# Patient Record
Sex: Female | Born: 1940 | Race: White | Hispanic: No | Smoking: Former smoker
Health system: Southern US, Community
[De-identification: ages and names within clinical notes are randomized; demographics above are authoritative.]

## PROBLEM LIST (undated history)

## (undated) DIAGNOSIS — N189 Chronic kidney disease, unspecified: Secondary | ICD-10-CM

## (undated) DIAGNOSIS — M719 Bursopathy, unspecified: Secondary | ICD-10-CM

## (undated) DIAGNOSIS — I10 Essential (primary) hypertension: Secondary | ICD-10-CM

## (undated) DIAGNOSIS — R251 Tremor, unspecified: Secondary | ICD-10-CM

## (undated) DIAGNOSIS — M8580 Other specified disorders of bone density and structure, unspecified site: Secondary | ICD-10-CM

## (undated) DIAGNOSIS — L900 Lichen sclerosus et atrophicus: Secondary | ICD-10-CM

## (undated) DIAGNOSIS — G809 Cerebral palsy, unspecified: Secondary | ICD-10-CM

## (undated) HISTORY — PX: CATARACT EXTRACTION: SUR2

## (undated) HISTORY — DX: Essential (primary) hypertension: I10

## (undated) HISTORY — DX: Lichen sclerosus et atrophicus: L90.0

## (undated) HISTORY — DX: Chronic kidney disease, unspecified: N18.9

## (undated) HISTORY — PX: TONSILLECTOMY: SUR1361

## (undated) HISTORY — DX: Other specified disorders of bone density and structure, unspecified site: M85.80

## (undated) HISTORY — DX: Cerebral palsy, unspecified: G80.9

## (undated) HISTORY — DX: Bursopathy, unspecified: M71.9

## (undated) HISTORY — DX: Tremor, unspecified: R25.1

---

## 1973-05-17 HISTORY — PX: ABDOMINAL HYSTERECTOMY: SHX81

## 1999-09-15 ENCOUNTER — Encounter: Payer: Self-pay | Admitting: Obstetrics and Gynecology

## 1999-09-15 ENCOUNTER — Encounter: Admission: RE | Admit: 1999-09-15 | Discharge: 1999-09-15 | Payer: Self-pay | Admitting: Obstetrics and Gynecology

## 2000-09-15 ENCOUNTER — Encounter: Admission: RE | Admit: 2000-09-15 | Discharge: 2000-09-15 | Payer: Self-pay | Admitting: Obstetrics and Gynecology

## 2000-09-15 ENCOUNTER — Encounter: Payer: Self-pay | Admitting: Obstetrics and Gynecology

## 2001-09-20 ENCOUNTER — Encounter: Admission: RE | Admit: 2001-09-20 | Discharge: 2001-09-20 | Payer: Self-pay | Admitting: Obstetrics and Gynecology

## 2001-09-20 ENCOUNTER — Encounter: Payer: Self-pay | Admitting: Obstetrics and Gynecology

## 2002-09-24 ENCOUNTER — Encounter: Admission: RE | Admit: 2002-09-24 | Discharge: 2002-09-24 | Payer: Self-pay | Admitting: Obstetrics and Gynecology

## 2002-09-24 ENCOUNTER — Encounter: Payer: Self-pay | Admitting: Obstetrics and Gynecology

## 2008-02-07 ENCOUNTER — Other Ambulatory Visit: Admission: RE | Admit: 2008-02-07 | Discharge: 2008-02-07 | Payer: Self-pay | Admitting: Obstetrics and Gynecology

## 2008-02-07 ENCOUNTER — Encounter: Payer: Self-pay | Admitting: Obstetrics and Gynecology

## 2008-02-07 ENCOUNTER — Ambulatory Visit: Payer: Self-pay | Admitting: Obstetrics and Gynecology

## 2008-03-25 ENCOUNTER — Ambulatory Visit: Payer: Self-pay | Admitting: Obstetrics and Gynecology

## 2009-02-25 ENCOUNTER — Other Ambulatory Visit: Admission: RE | Admit: 2009-02-25 | Discharge: 2009-02-25 | Payer: Self-pay | Admitting: Obstetrics and Gynecology

## 2009-02-25 ENCOUNTER — Ambulatory Visit: Payer: Self-pay | Admitting: Obstetrics and Gynecology

## 2010-03-03 ENCOUNTER — Ambulatory Visit: Payer: Self-pay | Admitting: Obstetrics and Gynecology

## 2010-03-03 ENCOUNTER — Other Ambulatory Visit: Admission: RE | Admit: 2010-03-03 | Discharge: 2010-03-03 | Payer: Self-pay | Admitting: Obstetrics and Gynecology

## 2010-05-14 ENCOUNTER — Ambulatory Visit: Payer: Self-pay | Admitting: Obstetrics and Gynecology

## 2011-02-24 DIAGNOSIS — I10 Essential (primary) hypertension: Secondary | ICD-10-CM | POA: Insufficient documentation

## 2011-02-24 DIAGNOSIS — G809 Cerebral palsy, unspecified: Secondary | ICD-10-CM | POA: Insufficient documentation

## 2011-02-24 DIAGNOSIS — N952 Postmenopausal atrophic vaginitis: Secondary | ICD-10-CM | POA: Insufficient documentation

## 2011-03-09 ENCOUNTER — Ambulatory Visit (INDEPENDENT_AMBULATORY_CARE_PROVIDER_SITE_OTHER): Payer: Medicare Other | Admitting: Obstetrics and Gynecology

## 2011-03-09 ENCOUNTER — Encounter: Payer: Self-pay | Admitting: Obstetrics and Gynecology

## 2011-03-09 VITALS — BP 124/76 | Ht 59.0 in | Wt 101.0 lb

## 2011-03-09 DIAGNOSIS — Z23 Encounter for immunization: Secondary | ICD-10-CM

## 2011-03-09 DIAGNOSIS — D069 Carcinoma in situ of cervix, unspecified: Secondary | ICD-10-CM

## 2011-03-09 DIAGNOSIS — Z780 Asymptomatic menopausal state: Secondary | ICD-10-CM

## 2011-03-09 DIAGNOSIS — N762 Acute vulvitis: Secondary | ICD-10-CM

## 2011-03-09 DIAGNOSIS — N951 Menopausal and female climacteric states: Secondary | ICD-10-CM

## 2011-03-09 DIAGNOSIS — N760 Acute vaginitis: Secondary | ICD-10-CM

## 2011-03-09 DIAGNOSIS — N952 Postmenopausal atrophic vaginitis: Secondary | ICD-10-CM

## 2011-03-09 MED ORDER — CLOBETASOL PROPIONATE 0.05 % EX CREA: TOPICAL_CREAM | Freq: Two times a day (BID) | CUTANEOUS | Status: DC

## 2011-03-09 NOTE — Progress Notes (Signed)
Subjective:     Patient ID: Wendy Lewis, female   DOB: Oct 22, 1940, 69 y.o.   MRN: [id number]  HPIpatient came back to see me today for further followup. Her menopausal symptoms have resolved to a point that they are not bothering her any more. She is having no vaginal bleeding. She is having no pelvic pain. She is a normal mammogram last month. She does her lab through her PCP. She's had several normal bone densities. Her biggest issue now is her vulvitis. It seems to come and go. He responds well to the Temovate we give her. She is having no dysuria frequency or urgency. She also has vaginal dryness but does not require intervention. She has a history of CIN but has had normal Pap smears recently.   Review of Systems  Constitutional: Negative.   HENT: Negative.   Eyes: Negative.   Respiratory: Negative.   Cardiovascular:       Hypertension well-controlled on lisinopril and Microzide.  Gastrointestinal: Negative.   Genitourinary: Negative.   Musculoskeletal: Negative.   Skin: Negative.   Neurological:       Cerebral palsy  Hematological: Negative.   Psychiatric/Behavioral: Negative.        Objective:   Physical ExamHEENT: Within normal limits. Kennon Portela present. Neck: No masses. Supraclavicular lymph nodes: Not enlarged. Breasts: Examined in both sitting and lying position. Symmetrical without skin changes or masses. Abdomen: Soft no masses guarding or rebound. No hernias. Pelvic: External within normal limits. BUS within normal limits. Vaginal examination shows good estrogen effect, no cystocele enterocele or rectocele. Cervix and uterus absent. Adnexa within normal limits. Rectovaginal confirmatory. Extremities within normal limits.      Assessment:     #1. Vulvitis #2. Menopausal symptoms #3. Atrophic vaginitis #4. CIN    Plan:    continue yearly mammograms. Continue Temovate ointment. New prescription written.

## 2011-10-14 ENCOUNTER — Other Ambulatory Visit: Payer: Self-pay | Admitting: Family Medicine

## 2011-10-14 DIAGNOSIS — N183 Chronic kidney disease, stage 3 unspecified: Secondary | ICD-10-CM

## 2011-10-19 ENCOUNTER — Ambulatory Visit
Admission: RE | Admit: 2011-10-19 | Discharge: 2011-10-19 | Disposition: A | Discharge status: Home / Self Care | Payer: Medicare Other | Source: Ambulatory Visit | Attending: Family Medicine | Admitting: Family Medicine

## 2012-03-09 ENCOUNTER — Encounter: Payer: Medicare Other | Admitting: Obstetrics and Gynecology

## 2012-04-18 ENCOUNTER — Ambulatory Visit (INDEPENDENT_AMBULATORY_CARE_PROVIDER_SITE_OTHER): Payer: Medicare Other | Admitting: Obstetrics and Gynecology

## 2012-04-18 ENCOUNTER — Encounter: Payer: Self-pay | Admitting: Obstetrics and Gynecology

## 2012-04-18 VITALS — BP 120/76 | Ht 60.0 in | Wt 108.0 lb

## 2012-04-18 DIAGNOSIS — N951 Menopausal and female climacteric states: Secondary | ICD-10-CM

## 2012-04-18 DIAGNOSIS — N952 Postmenopausal atrophic vaginitis: Secondary | ICD-10-CM

## 2012-04-18 DIAGNOSIS — N871 Moderate cervical dysplasia: Secondary | ICD-10-CM

## 2012-04-18 DIAGNOSIS — N904 Leukoplakia of vulva: Secondary | ICD-10-CM

## 2012-04-18 DIAGNOSIS — R232 Flushing: Secondary | ICD-10-CM

## 2012-04-18 MED ORDER — CLOBETASOL PROPIONATE 0.05 % EX CREA: TOPICAL_CREAM | Freq: Two times a day (BID) | CUTANEOUS | Status: DC

## 2012-04-18 NOTE — Progress Notes (Signed)
Patient came to see me today for further followup. 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 She has had normal Pap smears since her surgery. Her last Pap was 2011. She was bothered for years with hot flashes and took HRT. She has been off HRT since 2009 and is just minimally symptomatic. She is having no vaginal bleeding. She is having no pelvic pain. Her last bone density was December, 2011 and was normal. We are treating her with Temovate for recurrent vulvitis with excellent results. She does have significant atrophic vaginitis but is not sexually active. She sees both Dr. Cliffton Asters and the nephrologist and does her lab work through their offices. She has a kidney disease That does not require treatment and she was told by the nephrologist that she will not need dialysis ever.She had a mammogram this year and got a letter that It was normal. We did not get a copy but she will call and get [place] one.   ROS: 12 system review done. Pertinent positives above. Other positives are  hypertension and cerebral palsy.  HEENT: Within normal limits.Kennon Portela present. Neck: No masses. Supraclavicular lymph nodes: Not enlarged. Breasts: Examined in both sitting and lying position. Symmetrical without skin changes or masses. Abdomen: Soft no masses guarding or rebound. No hernias. Pelvic: External within normal limits. BUS within normal limits. Vaginal examination shows poor  estrogen effect, no cystocele enterocele or rectocele. Cervix and uterus absent. Adnexa within normal limits. Rectovaginal confirmatory. Extremities within normal limits.  Assessment: #1. Cervical dysplasia #2. Atrophic vaginitis #3.  Recurrent vulvitis #4. Menopausal symptoms-resolving  Plan: Continue Temovate. Continue yearly mammograms. Patient to get [place]  the mammogram report. Pap not done.The new Pap smear guidelines were discussed with the patient.

## 2012-04-18 NOTE — Patient Instructions (Signed)
Please get me a copy of your mammogram report from 2013.

## 2012-04-19 ENCOUNTER — Encounter: Payer: Self-pay | Admitting: Obstetrics and Gynecology

## 2013-05-01 ENCOUNTER — Encounter: Payer: Self-pay | Admitting: Gynecology

## 2013-05-01 ENCOUNTER — Other Ambulatory Visit (HOSPITAL_COMMUNITY)
Admission: RE | Admit: 2013-05-01 | Discharge: 2013-05-01 | Disposition: A | Discharge status: Home / Self Care | Payer: Medicare Other | Source: Ambulatory Visit | Attending: Gynecology | Admitting: Gynecology

## 2013-05-01 ENCOUNTER — Ambulatory Visit (INDEPENDENT_AMBULATORY_CARE_PROVIDER_SITE_OTHER): Payer: Medicare Other | Admitting: Gynecology

## 2013-05-01 VITALS — BP 122/74 | Ht 59.0 in | Wt 107.0 lb

## 2013-05-01 DIAGNOSIS — Z124 Encounter for screening for malignant neoplasm of cervix: Secondary | ICD-10-CM | POA: Insufficient documentation

## 2013-05-01 DIAGNOSIS — N763 Subacute and chronic vulvitis: Secondary | ICD-10-CM

## 2013-05-01 DIAGNOSIS — N760 Acute vaginitis: Secondary | ICD-10-CM

## 2013-05-01 DIAGNOSIS — N952 Postmenopausal atrophic vaginitis: Secondary | ICD-10-CM

## 2013-05-01 DIAGNOSIS — Z1272 Encounter for screening for malignant neoplasm of vagina: Secondary | ICD-10-CM

## 2013-05-01 NOTE — Progress Notes (Signed)
Wendy Lewis February 10, 1941 [id number]        72 y.o.  G2P2002 for followup exam.  Former patient Wendy Lewis. Several issues noted below.  Past medical history,surgical history, problem list, medications, allergies, family history and social history were all reviewed and documented in the EPIC chart.  ROS:  Performed and pertinent positives and negatives are included in the history, assessment and plan .  Exam: [name] Filed Vitals:   05/01/13 1359  BP: 122/74  Height: 4\' 11"  (1.499 m)  Weight: 107 lb (48.535 kg)   General appearance  Normal Skin grossly normal Head/Neck normal with no cervical or supraclavicular adenopathy thyroid normal Lungs  clear Cardiac RR, without RMG Abdominal  soft, nontender, without masses, organomegaly or hernia Breasts  examined lying and sitting without masses, retractions, discharge or axillary adenopathy. Pelvic  Ext/BUS/vagina  generalized atrophic changes with irritative vulvitis posterior fourchette/perineal body  Adnexa  Without masses or tenderness    Anus and perineum  Normal   Rectovaginal  Normal sphincter tone without palpated masses or tenderness.    Assessment/Plan:  72 y.o. [id number] female for followup exam.   1. Postmenopausal/atrophic genital changes/irritative vulvitis. Patient had been on ERT in the past but has discontinued doing well without significant hot flashes or night sweats. Is not sexually active. Status post TAH in 1975 for cervical dysplasia. Does have irritative vulvitis that she uses Temovate cream intermittently. This seems to control her symptoms and has been doing this for years. I reviewed that I do not want her to use it every day as it can thin the skin and she understands and uses the intermittently. She has a prescription at home but will call when she needs more. 2. Pap smear 2011. Patient has noted under history CIN-1 but under prior note by Dr. Eda Paschal said severe dysplasia for indication for  hysterectomy. Given lack of clarity as to the significance of her dysplasia I will continue to do Pap smears and less frequent interval and a Pap smear was done today. We'll plan every 3 year Pap smears. 3. Mammography reported this September. Last mammogram in chart 2013 the patient is sure that she had it done now. We'll continue with annual mammography. SBE monthly reviewed. 4. DEXA 2011 normal. Recommend repeat at 5 year interval. Increase calcium vitamin D reviewed. 5. Colonoscopy 6 years ago with planned repeat interval at 5 years. 6. Health maintenance. No blood work done as this is all done through her primary physician's office. Followup one year, sooner as needed.   Note: This document was prepared with digital dictation and possible smart phrase technology. Any transcriptional errors that result from this process are unintentional.   [name] MD, 2:20 PM 05/01/2013

## 2013-05-01 NOTE — Patient Instructions (Signed)
Follow up in one year for annual exam 

## 2013-05-01 NOTE — Addendum Note (Signed)
Addended by: Dayna Barker on: 05/01/2013 02:30 PM   Modules accepted: Orders

## 2013-05-09 ENCOUNTER — Encounter: Payer: Self-pay | Admitting: Obstetrics and Gynecology

## 2014-03-18 ENCOUNTER — Encounter: Payer: Self-pay | Admitting: Gynecology

## 2014-05-03 ENCOUNTER — Encounter: Payer: Self-pay | Admitting: Gynecology

## 2014-05-03 ENCOUNTER — Ambulatory Visit (INDEPENDENT_AMBULATORY_CARE_PROVIDER_SITE_OTHER): Payer: Medicare Other | Admitting: Gynecology

## 2014-05-03 VITALS — BP 118/74 | Ht 60.0 in | Wt 107.0 lb

## 2014-05-03 DIAGNOSIS — N762 Acute vulvitis: Secondary | ICD-10-CM

## 2014-05-03 DIAGNOSIS — N952 Postmenopausal atrophic vaginitis: Secondary | ICD-10-CM

## 2014-05-03 MED ORDER — CLOBETASOL PROPIONATE 0.05 % EX CREA: TOPICAL_CREAM | CUTANEOUS | Status: DC

## 2014-05-03 NOTE — Progress Notes (Signed)
Wendy Lewis December 21, 1940 [id number]        73 y.o.  G2P2002 for follow up exam. Several issues noted below.  Past medical history,surgical history, problem list, medications, allergies, family history and social history were all reviewed and documented as reviewed in the EPIC chart.  ROS:  Performed with pertinent positives and negatives included in the history, assessment and plan.   Additional significant findings :  none   Exam: Wendy Lewis Filed Vitals:   05/03/14 1527  BP: 118/74  Height: 5' (1.524 m)  Weight: 107 lb (48.535 kg)   General appearance:  Normal affect, orientation and appearance. Skin: Grossly normal HEENT: Without gross lesions.  No cervical or supraclavicular adenopathy. Thyroid normal.  Lungs:  Clear without wheezing, rales or rhonchi Cardiac: RR, without RMG Abdominal:  Soft, nontender, without masses, guarding, rebound, organomegaly or hernia Breasts:  Examined lying and sitting without masses, retractions, discharge or axillary adenopathy. Pelvic:  Ext/BUS/vagina with significant atrophic changes.  Adnexa  Without masses or tenderness    Anus and perineum  Normal   Rectovaginal  Normal sphincter tone without palpated masses or tenderness.    Assessment/Plan:  73 y.o. [id number] female for follow up exam.   1. Postmenopausal/atrophic genital changes/tonic vulvitis.  No significant hot flushes, night sweats. Patient using clobetasol 0.05% cream as needed for irritation and seems to be doing well with this. Uses one tube over a years time. I reviewed options to consider vaginal estrogen see if we cannot strengthen the tissues but she is not interested in this. She had taken HRT years ago but does not want to consider estrogen at this time. Patient will follow up if she has any issues with her vulvitis. I refilled her clobetasol 1 year.  2. Pap smear 2014. History of high-grade dysplasia per Dr. Verl DickerGottsegen's note as a reason for her hysterectomy. No Pap smear  done today. We'll continue with screening at 3 year intervals. 3. Mammography 01/2014. Continue with annual mammography. SBE monthly reviewed. 4. Colonoscopy 7 years ago with reported repeat interval 10 years. 5. DEXA 2011 normal. Plan repeat next year at five-year interval. Increased calcium vitamin D reviewed. 6. Health maintenance. No routine lab work done as patient reports this done at her primary physician's office. Patient also reports having received the Pneumovax and Zostavax vaccines.  Follow up in one year, sooner as needed.     Wendy Lewis,Wendy Graziani P MD, 3:46 PM 05/03/2014

## 2014-05-03 NOTE — Patient Instructions (Signed)
Use the clobetasol cream as needed for irritation. Call me if you have any problems. Call me if you do any vaginal bleeding.

## 2015-05-15 ENCOUNTER — Encounter: Payer: Self-pay | Admitting: Gynecology

## 2015-05-15 ENCOUNTER — Ambulatory Visit (INDEPENDENT_AMBULATORY_CARE_PROVIDER_SITE_OTHER): Payer: Medicare Other | Admitting: Gynecology

## 2015-05-15 VITALS — BP 116/74 | Ht 59.0 in | Wt 105.0 lb

## 2015-05-15 DIAGNOSIS — Z01419 Encounter for gynecological examination (general) (routine) without abnormal findings: Secondary | ICD-10-CM

## 2015-05-15 DIAGNOSIS — N760 Acute vaginitis: Secondary | ICD-10-CM

## 2015-05-15 DIAGNOSIS — N952 Postmenopausal atrophic vaginitis: Secondary | ICD-10-CM | POA: Diagnosis not present

## 2015-05-15 LAB — WET PREP FOR TRICH, YEAST, CLUE
Clue Cells Wet Prep HPF POC: NONE SEEN
Trich, Wet Prep: NONE SEEN
Yeast Wet Prep HPF POC: NONE SEEN

## 2015-05-15 MED ORDER — FLUCONAZOLE 150 MG PO TABS: 150.0000 mg | ORAL_TABLET | Freq: Every day | ORAL | Status: DC

## 2015-05-15 MED ORDER — NYSTATIN 100000 UNIT/GM EX CREA: 1.0000 "application " | TOPICAL_CREAM | Freq: Two times a day (BID) | CUTANEOUS | Status: DC

## 2015-05-15 NOTE — Progress Notes (Signed)
Wendy Lewis March 31, 1941 [id number]        74 y.o.  G2P2002  for breast and pelvic exam. Several issues noted below  Past medical history,surgical history, problem list, medications, allergies, family history and social history were all reviewed and documented as reviewed in the EPIC chart.  ROS:  Performed with pertinent positives and negatives included in the history, assessment and plan.   Additional significant findings :  none   Exam: Charity fundraiserBlanca assistant Filed Vitals:   05/15/15 1519  BP: 116/74  Height: 4\' 11"  (1.499 m)  Weight: 105 lb (47.628 kg)   General appearance:  Normal affect, orientation and appearance. Skin: Grossly normal HEENT: Without gross lesions.  No cervical or supraclavicular adenopathy. Thyroid normal.  Lungs:  Clear without wheezing, rales or rhonchi Cardiac: RR, without RMG Abdominal:  Soft, nontender, without masses, guarding, rebound, organomegaly or hernia Breasts:  Examined lying and sitting without masses, retractions, discharge or axillary adenopathy. Pelvic:  Ext/BUS/vagina with intense symmetrical vulvitis from periclitoral region to perianal region. No specific lesions. Overall atrophic changes. No significant discharge  Adnexa  Without masses or tenderness    Anus and perineum  Normal   Rectovaginal  Normal sphincter tone without palpated masses or tenderness.    Assessment/Plan:  74 y.o. [id number] female for breast and pelvic exam.  1. Vulvitis. Patient has been treated intermittently with clobetasol over the past several years by Dr. Eda PaschalGottsegen and subsequent a by myself with good relief. Now notes that the itching and irritation has persisted. Does have a symmetrical erythematous vulvitis with no specific lesions. No significant discharge. Wet prep negative. I'm going to cover her for yeast with Diflucan 150 mg daily 7 days and nystatin cream twice daily. I have asked her to make appointment to see me in one month in follow up. 2. Pap smear  2014.  No Pap smear done today.  History of dysplasia for reason for hysterectomy in 1975 with normal Pap smears afterwards. 3. Mammography 2016. Continue with annual mammography when due. SBE monthly reviewed. 4. Colonoscopy 8 years ago. Reported repeat interval 10 years. 5. DEXA 2011 normal. Recommend repeat DEXA now five-year interval. Patient agrees to schedule. 6. Health maintenance. No routine lab work done today as this is done at her primary physician's office. Follow up 1 year, sooner as needed.   Wendy Lewis,Wendy Lewis, 3:54 PM 05/15/2015

## 2015-05-15 NOTE — Patient Instructions (Signed)
Take the Diflucan pill daily for 7 days  Apply the nystatin cream externally around the vagina twice daily  Follow up in one month for reexamination  Schedule your bone density sometime over the next several months.

## 2015-05-15 NOTE — Addendum Note (Signed)
Addended by: Berna SpareASTILLO, [name] A on: 05/15/2015 04:32 PM   Modules accepted: Orders

## 2015-05-18 DIAGNOSIS — L900 Lichen sclerosus et atrophicus: Secondary | ICD-10-CM

## 2015-05-18 HISTORY — DX: Lichen sclerosus et atrophicus: L90.0

## 2015-06-11 ENCOUNTER — Ambulatory Visit (INDEPENDENT_AMBULATORY_CARE_PROVIDER_SITE_OTHER): Payer: Medicare Other | Admitting: Gynecology

## 2015-06-11 ENCOUNTER — Encounter: Payer: Self-pay | Admitting: Gynecology

## 2015-06-11 VITALS — BP 118/76

## 2015-06-11 DIAGNOSIS — N9089 Other specified noninflammatory disorders of vulva and perineum: Secondary | ICD-10-CM | POA: Diagnosis not present

## 2015-06-11 NOTE — Progress Notes (Signed)
KEDRA MCGLADE 1940/12/05 [id number]        75 y.o.  G2P2002 presents in follow up having recently been seen for intense vulvitis thought to be related to yeast and treated with nystatin cream and Diflucan orally 1 week.  Her symptoms seem a little better but not substantially.  Past medical history,surgical history, problem list, medications, allergies, family history and social history were all reviewed and documented in the EPIC chart.  Directed ROS with pertinent positives and negatives documented in the history of present illness/assessment and plan.  Exam: Kennon Portela assistant Filed Vitals:   06/11/15 1516  BP: 118/76   General appearance:  Normal With symmetrical generalized intense vulvitis from the periclitoral region to the perianal region. Intermittent blanching suggestive of possible lichen sclerosis. Vaginal exam with significant atrophy and no lesions.  Procedure: Representative area of blanching right peroneal body cleansed with Betadine, infiltrated with 1% Xylocaine and a skin biopsy was taken. Silver nitrate hemostasis applied. Patient tolerated well.  Assessment/Plan:  75 y.o. [id number] with intense persistent vulvitis. Suspect possible lichen sclerosis. Had been treated in the past with clobetasol intermittently for similar symptoms. Will follow up for biopsy results and probable treatment with clobetasol.    [name] MD, 4:03 PM 06/11/2015

## 2015-06-11 NOTE — Patient Instructions (Signed)
Office will call you with biopsy results 

## 2015-06-16 ENCOUNTER — Other Ambulatory Visit: Payer: Self-pay | Admitting: Gynecology

## 2015-06-16 ENCOUNTER — Encounter: Payer: Self-pay | Admitting: Gynecology

## 2015-06-16 ENCOUNTER — Telehealth: Payer: Self-pay | Admitting: *Deleted

## 2015-06-16 MED ORDER — CLOBETASOL PROPIONATE 0.05 % EX CREA
TOPICAL_CREAM | CUTANEOUS | Status: DC
Start: 2015-06-16 — End: 2017-03-01

## 2015-06-16 NOTE — Telephone Encounter (Signed)
Pt called requesting pathology report results from 06/11/15 OV, I told pt results not back yet will call once they return

## 2015-06-18 DIAGNOSIS — M8580 Other specified disorders of bone density and structure, unspecified site: Secondary | ICD-10-CM

## 2015-06-18 HISTORY — DX: Other specified disorders of bone density and structure, unspecified site: M85.80

## 2015-06-23 ENCOUNTER — Ambulatory Visit (INDEPENDENT_AMBULATORY_CARE_PROVIDER_SITE_OTHER): Payer: Medicare Other

## 2015-06-23 ENCOUNTER — Other Ambulatory Visit: Payer: Self-pay | Admitting: Gynecology

## 2015-06-23 DIAGNOSIS — M899 Disorder of bone, unspecified: Secondary | ICD-10-CM

## 2015-06-23 DIAGNOSIS — Z13820 Encounter for screening for osteoporosis: Secondary | ICD-10-CM

## 2015-06-23 DIAGNOSIS — M8580 Other specified disorders of bone density and structure, unspecified site: Secondary | ICD-10-CM

## 2015-06-23 DIAGNOSIS — Z01419 Encounter for gynecological examination (general) (routine) without abnormal findings: Secondary | ICD-10-CM

## 2015-06-24 ENCOUNTER — Telehealth: Payer: Self-pay | Admitting: Gynecology

## 2015-06-24 ENCOUNTER — Encounter: Payer: Self-pay | Admitting: Gynecology

## 2015-06-24 NOTE — Telephone Encounter (Signed)
Tell patient her bone density showed a little bit of osteopenia. Recommended she have a vitamin D level checked either here or at her primary physician's office. Otherwise nothing special at this point and we will repeat her bone density in 2 years

## 2015-06-24 NOTE — Telephone Encounter (Signed)
Pt informed with the below note. 

## 2015-06-24 NOTE — Telephone Encounter (Signed)
Left message for pt to call.

## 2015-06-24 NOTE — Telephone Encounter (Signed)
Will have Vitamin d drawn at PCP office and faxed to our office

## 2016-04-22 ENCOUNTER — Other Ambulatory Visit: Payer: Self-pay | Admitting: Family Medicine

## 2016-04-22 DIAGNOSIS — S0093XD Contusion of unspecified part of head, subsequent encounter: Secondary | ICD-10-CM

## 2016-04-26 ENCOUNTER — Ambulatory Visit
Admission: RE | Admit: 2016-04-26 | Discharge: 2016-04-26 | Disposition: A | Discharge status: Home / Self Care | Payer: Medicare Other | Source: Ambulatory Visit | Attending: Family Medicine | Admitting: Family Medicine

## 2016-04-26 DIAGNOSIS — S0093XD Contusion of unspecified part of head, subsequent encounter: Secondary | ICD-10-CM

## 2016-05-19 ENCOUNTER — Ambulatory Visit (INDEPENDENT_AMBULATORY_CARE_PROVIDER_SITE_OTHER): Payer: Medicare Other | Admitting: Gynecology

## 2016-05-19 ENCOUNTER — Encounter: Payer: Self-pay | Admitting: Gynecology

## 2016-05-19 VITALS — BP 118/76 | Ht 59.0 in | Wt 105.0 lb

## 2016-05-19 DIAGNOSIS — N952 Postmenopausal atrophic vaginitis: Secondary | ICD-10-CM | POA: Diagnosis not present

## 2016-05-19 DIAGNOSIS — L900 Lichen sclerosus et atrophicus: Secondary | ICD-10-CM | POA: Diagnosis not present

## 2016-05-19 DIAGNOSIS — Z01411 Encounter for gynecological examination (general) (routine) with abnormal findings: Secondary | ICD-10-CM | POA: Diagnosis not present

## 2016-05-19 DIAGNOSIS — M8580 Other specified disorders of bone density and structure, unspecified site: Secondary | ICD-10-CM | POA: Diagnosis not present

## 2016-05-19 NOTE — Addendum Note (Signed)
Addended by: Dayna BarkerGARDNER, Irene Mitcham K on: 05/19/2016 02:57 PM   Modules accepted: Orders

## 2016-05-19 NOTE — Patient Instructions (Signed)

## 2016-05-19 NOTE — Progress Notes (Signed)
   Wendy Lewis 07-07-1940 [id number]        76 y.o.  G2P2002 for annual exam.    Past medical history,surgical history, problem list, medications, allergies, family history and social history were all reviewed and documented as reviewed in the EPIC chart.  ROS:  Performed with pertinent positives and negatives included in the history, assessment and plan.   Additional significant findings :  None  Exam: Kennon PortelaKim Gardner assistant Vitals:   05/19/16 1424  BP: 118/76  Weight: 105 lb (47.6 kg)  Height: 4\' 11"  (1.499 m)   Body mass index is 21.21 kg/m.  General appearance:  Normal affect, orientation and appearance. Skin: Grossly normal HEENT: Without gross lesions.  No cervical or supraclavicular adenopathy. Thyroid normal.  Lungs:  Clear without wheezing, rales or rhonchi Cardiac: RR, without RMG Abdominal:  Soft, nontender, without masses, guarding, rebound, organomegaly or hernia Breasts:  Examined lying and sitting without masses, retractions, discharge or axillary adenopathy. Pelvic:  Ext, BUS, Vagina with atrophic changes. Mild generalized vulvitis noted  Adnexa without masses or tenderness    Anus and perineum normal   Rectovaginal normal sphincter tone without palpated masses or tenderness.    Assessment/Plan:  76 y.o. 782P2002 female for annual exam.   1. Postmenopausal/atrophic genital changes. Status post TAH in the past for cervical dysplasia. Doing well without significant hot flushes, night sweats, vaginal dryness. 2. 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 Using clobetasol twice weekly with complete symptom relief. Has supply at home but will: Needs more. 3. Pap smear 2014. Pap smear done today given history of dysplasia previously. 4. Mammography 01/2016. Continue with annual mammography when due. SBE monthly reviewed. 5. Colonoscopy 9 years ago with reported repeat interval 10 years. 6. DEXA 06/2015 T score -1.6 FRAX 10%/2%. Plan repeat DEXA at 2 year  interval. 7. Health maintenance. No routine lab work done as patient reports is done elsewhere. Follow up 1 year, sooner as needed.   Dara LordsFONTAINE,Namya Voges P MD, 2:45 PM 05/19/2016

## 2016-05-20 LAB — PAP IG W/ RFLX HPV ASCU

## 2016-09-29 ENCOUNTER — Encounter: Payer: Self-pay | Admitting: Gynecology

## 2017-03-01 ENCOUNTER — Encounter (HOSPITAL_COMMUNITY): Payer: Self-pay | Admitting: *Deleted

## 2017-03-01 ENCOUNTER — Emergency Department (HOSPITAL_COMMUNITY): Payer: Medicare Other

## 2017-03-01 ENCOUNTER — Emergency Department (HOSPITAL_COMMUNITY)
Admission: EM | Admit: 2017-03-01 | Discharge: 2017-03-02 | Disposition: A | Discharge status: Home / Self Care | Payer: Medicare Other | Attending: Emergency Medicine | Admitting: Emergency Medicine

## 2017-03-01 DIAGNOSIS — Z79899 Other long term (current) drug therapy: Secondary | ICD-10-CM | POA: Diagnosis not present

## 2017-03-01 DIAGNOSIS — J40 Bronchitis, not specified as acute or chronic: Secondary | ICD-10-CM | POA: Insufficient documentation

## 2017-03-01 DIAGNOSIS — Z87891 Personal history of nicotine dependence: Secondary | ICD-10-CM | POA: Diagnosis not present

## 2017-03-01 DIAGNOSIS — R079 Chest pain, unspecified: Secondary | ICD-10-CM

## 2017-03-01 DIAGNOSIS — I10 Essential (primary) hypertension: Secondary | ICD-10-CM | POA: Diagnosis not present

## 2017-03-01 DIAGNOSIS — R000 Tachycardia, unspecified: Secondary | ICD-10-CM

## 2017-03-01 DIAGNOSIS — Z7982 Long term (current) use of aspirin: Secondary | ICD-10-CM | POA: Diagnosis not present

## 2017-03-01 LAB — CBC
HCT: 33.9 % — ABNORMAL LOW (ref 36.0–46.0)
Hemoglobin: 11.4 g/dL — ABNORMAL LOW (ref 12.0–15.0)
MCH: 30.8 pg (ref 26.0–34.0)
MCHC: 33.6 g/dL (ref 30.0–36.0)
MCV: 91.6 fL (ref 78.0–100.0)
PLATELETS: 224 10*3/uL (ref 150–400)
RBC: 3.7 MIL/uL — AB (ref 3.87–5.11)
RDW: 12.8 % (ref 11.5–15.5)
WBC: 6.6 10*3/uL (ref 4.0–10.5)

## 2017-03-01 LAB — BASIC METABOLIC PANEL
Anion gap: 10 (ref 5–15)
BUN: 29 mg/dL — ABNORMAL HIGH (ref 6–20)
CHLORIDE: 102 mmol/L (ref 101–111)
CO2: 26 mmol/L (ref 22–32)
CREATININE: 1.22 mg/dL — AB (ref 0.44–1.00)
Calcium: 9.3 mg/dL (ref 8.9–10.3)
GFR, EST AFRICAN AMERICAN: 49 mL/min — AB (ref >60)
GFR, EST NON AFRICAN AMERICAN: 42 mL/min — AB (ref >60)
Glucose, Bld: 104 mg/dL — ABNORMAL HIGH (ref 65–99)
Potassium: 4.6 mmol/L (ref 3.5–5.1)
SODIUM: 138 mmol/L (ref 135–145)

## 2017-03-01 LAB — D-DIMER, QUANTITATIVE (NOT AT ARMC): D DIMER QUANT: 0.64 ug{FEU}/mL — AB (ref 0.00–0.50)

## 2017-03-01 LAB — I-STAT TROPONIN, ED: TROPONIN I, POC: 0 ng/mL (ref 0.00–0.08)

## 2017-03-01 MED ORDER — IOPAMIDOL (ISOVUE-370) INJECTION 76%
INTRAVENOUS | Status: AC
  Administered 2017-03-01: 100 mL
  Filled 2017-03-01: qty 100

## 2017-03-01 NOTE — ED Triage Notes (Signed)
Pt has been having chest for a couple of weeks and was seen at [facility] walk in where she had labs and EKG. Pt was told that EKG was abnormal so she was sent here for evaluation.  Her paperwork states that she was sent here for r/o PE.  Pt denies any increase in sob.  Pt is pain free at this time and appears in no distress.  EKG done in triage, please note that due to pt tremor related to her Cerebral palsy there was a lot of artifact on the EKG despite our efforts.

## 2017-03-01 NOTE — ED Provider Notes (Signed)
[facility] EMERGENCY DEPARTMENT Provider Note   CSN: [id number] Arrival date & time: 03/01/17  1930   History   Chief Complaint Chief Complaint  Patient presents with  . Chest Pain    sent by MD, abn, EKG   HPI Wendy Lewis is a 76 y.o. female.  The patient is a 76 year old female with a past medical history significant for cerebral palsy, reported CKD stage 3, and hypertension, who presents to the ED in the company of her daughter complaining of chest pain.  Her chest pain has been present for the past several weeks.  The pain is intermittent. It improves with movement and worsens with rest. She has taken Tylenol without relief of symptoms. She reports similar episodes of chest pain in the past, however these have resolved more quickly; the prolonged period of time is what caused her to see her primary care doctor earlier today.  Her PCP performed an EKG and then instructed her to come to the ED due to concerns for a possible pulmonary embolism.  At this time, her pain is in her bilateral chest and radiates to her bilateral arms.  No pain in the back.  No exogenous estrogen use, active malignancy, or extended travel. No calf pain or tenderness. No nausea, vomiting, or diaphoresis associated with her chest pain.   The history is provided by the patient, medical records and a relative. No language interpreter was used.    Past Medical History:  Diagnosis Date  . Bursitis   . CP (cerebral palsy) (HCC)   . Hypertension   . Lichen sclerosus 05/2015   vulvar biopsy  . Osteopenia 06/2015   T score -1.6 FRAX 10%/2%   Patient Active Problem List   Diagnosis Date Noted  . Atrophic vaginitis   . CP (cerebral palsy) (HCC)   . Hypertension    Past Surgical History:  Procedure Laterality Date  . ABDOMINAL HYSTERECTOMY  1975   TAH for cervical dysplasia  . CATARACT EXTRACTION     Bilateral  . TONSILLECTOMY     OB History    Gravida Para Term Preterm AB Living     SAB TAB Ectopic Multiple Live Births                 Home Medications    Prior to Admission medications   Medication Sig Start Date End Date Taking? Authorizing Provider  aspirin 81 MG tablet Take 81 mg by mouth daily.     Yes [provider]  Cholecalciferol (VITAMIN D PO) Take 1 tablet by mouth daily.    Yes [provider]  lisinopril-hydrochlorothiazide (PRINZIDE,ZESTORETIC) 20-25 MG tablet Take 1 tablet by mouth daily.   Yes [provider]  Multiple Vitamin (MULTIVITAMIN WITH MINERALS) TABS tablet Take 1 tablet by mouth daily.   Yes [provider]  nystatin cream (MYCOSTATIN) Apply 1 application topically 2 (two) times daily. Patient taking differently: Apply 1 application topically 3 (three) times a week.  05/15/15  Yes Fontaine, Nadyne Coombes, MD   Family History Family History  Problem Relation Age of Onset  . Diabetes Mother   . Hypertension Mother   . Heart disease Mother   . Cancer Sister        THYROID  . Cancer Paternal Grandmother        UTERINE OR OVARIAN?  Marland Kitchen Hypertension Daughter    Social History Social History  Substance Use Topics  .  Smoking status: Former Games developer  . Smokeless tobacco: Never Used  . Alcohol use No   Allergies   Patient has no known allergies.  Review of Systems Review of Systems  Constitutional: Negative for chills and fever.  HENT: Negative.   Respiratory: Negative for cough and shortness of breath.   Cardiovascular: Positive for chest pain.  Gastrointestinal: Negative for abdominal pain, diarrhea and vomiting.  Genitourinary: Negative.  Negative for decreased urine volume and frequency.  Musculoskeletal: Negative.   Skin: Negative.   Allergic/Immunologic: Negative for immunocompromised state.  Neurological: Positive for tremors (chronic, stable).  Hematological: Negative.   Psychiatric/Behavioral: Negative.    Physical Exam Updated Vital Signs BP (!) 144/102   Pulse 92    Temp 98.8 F (37.1 C) (Oral)   Resp (!) 28   Wt 47.6 kg (105 lb)   SpO2 97%   BMI 21.21 kg/m   Physical Exam  Constitutional: She is oriented to person, place, and time. She appears well-developed and well-nourished. No distress.  HENT:  Head: Normocephalic and atraumatic.  Mouth/Throat: Oropharynx is clear and moist.  Eyes: Conjunctivae and EOM are normal.  Neck: Neck supple.  Cardiovascular: Regular rhythm, normal heart sounds and intact distal pulses.   No murmur heard. tachycardia  Pulmonary/Chest: Effort normal and breath sounds normal. No stridor. No respiratory distress.  Abdominal: Soft. She exhibits no mass. There is no tenderness. There is no guarding.  Musculoskeletal: She exhibits no edema.  Neurological: She is alert and oriented to person, place, and time.  Skin: Skin is warm and dry. Capillary refill takes less than 2 seconds.  Psychiatric: She has a normal mood and affect. Her behavior is normal. Judgment and thought content normal.  Nursing note and vitals reviewed.  ED Treatments / Results  Labs (all labs ordered are listed, but only abnormal results are displayed) Labs Reviewed  BASIC METABOLIC PANEL - Abnormal; Notable for the following:       Result Value   Glucose, Bld 104 (*)    BUN 29 (*)    Creatinine, Ser 1.22 (*)    GFR calc non Af Amer 42 (*)    GFR calc Af Amer 49 (*)    All other components within normal limits  CBC - Abnormal; Notable for the following:    RBC 3.70 (*)    Hemoglobin 11.4 (*)    HCT 33.9 (*)    All other components within normal limits  D-DIMER, QUANTITATIVE (NOT AT [facility]) - Abnormal; Notable for the following:    D-Dimer, Quant 0.64 (*)    All other components within normal limits  I-STAT TROPONIN, ED    EKG  EKG Interpretation  Date/Time:  Tuesday March 01 2017 19:47:32 EDT Ventricular Rate:  102 PR Interval:  112 QRS Duration: 66 QT Interval:  346 QTC Calculation: 450 R Axis:   41 Text Interpretation:  Sinus  tachycardia Abnormal ECG Artifact Low voltage QRS No old tracing to compare Confirmed by Dione Booze ([id number]) on 03/01/2017 7:59:20 PM      Radiology Dg Chest 2 View  Result Date: 03/01/2017 CLINICAL DATA:  Left-sided chest pain and congestion for 2 weeks. Ex-smoker. Hypertension. EXAM: CHEST  2 VIEW COMPARISON:  Earlier today at [facility]. FINDINGS: 2043 hours. Patient rotated to the right minimally. Midline trachea. Normal heart size. Tortuous thoracic aorta. No pleural effusion or pneumothorax. EKG lead projects over the left upper lobe. Clear lungs. IMPRESSION: No acute cardiopulmonary disease. Electronically Signed   By: Ronaldo Miyamoto  Reche Dixon M.D.   On: 03/01/2017 20:59   Ct Angio Chest Pe W And/or Wo Contrast  Result Date: 03/02/2017 CLINICAL DATA:  76 y/o  F; chest pain, PE suspected. EXAM: CT ANGIOGRAPHY CHEST WITH CONTRAST TECHNIQUE: Multidetector CT imaging of the chest was performed using the standard protocol during bolus administration of intravenous contrast. Multiplanar CT image reconstructions and MIPs were obtained to evaluate the vascular anatomy. CONTRAST:  100 cc Isovue 370 COMPARISON:  None. FINDINGS: Cardiovascular: Satisfactory opacification of the pulmonary arteries to the segmental level. No evidence of pulmonary embolism. Normal heart size. No pericardial effusion. Mediastinum/Nodes: No enlarged mediastinal, hilar, or axillary lymph nodes. Thyroid gland, trachea, and esophagus demonstrate no significant findings. Lungs/Pleura: Few clustered nodules in the periphery of left lung base and peribronchial thickening compatible with mild bronchiolitis. No consolidation, effusion, or pneumothorax. Upper Abdomen: No acute abnormality. Musculoskeletal: No chest wall abnormality. No acute or significant osseous findings. Review of the MIP images confirms the above findings. IMPRESSION: 1. No pulmonary embolus identified. 2. Mild bronchitis/bronchiolitis.  No consolidation.  Electronically Signed   By: Mitzi Hansen M.D.   On: 03/02/2017 00:14   Procedures Procedures (including critical care time)  Medications Ordered in ED Medications  iopamidol (ISOVUE-370) 76 % injection (100 mLs  Contrast Given 03/01/17 2347)  albuterol (PROVENTIL HFA;VENTOLIN HFA) 108 (90 Base) MCG/ACT inhaler 2 puff (2 puffs Inhalation Given 03/02/17 0034)    Initial Impression / Assessment and Plan / ED Course  I have reviewed the triage vital signs and the nursing notes.  Pertinent labs & imaging results that were available during my care of the patient were reviewed by me and considered in my medical decision making (see chart for details).    Initial differential diagnosis included pulmonary embolus, ACS, pneumonia, pleural effusion, esophageal tear/perforation, costochondritis, and musculoskeletal strain/sprain.  Pertinent labs included normocytic anemia Hgb 11.4 (no baseline on record) and elevated creatinine 1.22 (baseline unknown).  Troponin negative.  D-dimer elevated.  EKG with sinus tachycardia and no axis deviation.  Normal PR interval, narrow QRS complex, and normal QTc.  No noted T wave inversions or ST changes suggestive of ischemia or infarct, however significant artifact due to baseline tremor (cerebral palsy).  Imaging studies included a CXR with no acute cardiopulmonary abnormalities.  CTA chest performed in the setting of a positive d-dimer and tachycardia and revealed no pulmonary embolism, however bronchitis noted.  The patient was given an Albuterol treatment prior to discharge and sent home with an inhaler.  She was  Instructed to treat her symptoms with supportive care and follow-up with her PCP as needed. The patient was discharged in stable condition.  Final Clinical Impressions(s) / ED Diagnoses   Final diagnoses:  Tachycardia  Chest pain, unspecified type  Bronchitis   New Prescriptions New Prescriptions   No medications on file     Levester Fresh, MD 03/02/17 1610    Dione Booze, MD 03/03/17 [phone]

## 2017-03-02 MED ORDER — ALBUTEROL SULFATE HFA 108 (90 BASE) MCG/ACT IN AERS
2.0000 | INHALATION_SPRAY | Freq: Once | RESPIRATORY_TRACT | Status: AC
  Administered 2017-03-02: 2 via RESPIRATORY_TRACT
  Filled 2017-03-02: qty 6.7

## 2017-03-02 NOTE — Discharge Instructions (Signed)
Please use the inhaler provided for you every 4-6 hours as needed.  Follow-up with your primary care physician if symptoms persist.

## 2017-04-23 MED ORDER — IOPAMIDOL (ISOVUE-300) INJECTION 61%
INTRAVENOUS | Status: AC
  Filled 2017-04-23: qty 100

## 2017-05-13 ENCOUNTER — Telehealth: Payer: Self-pay | Admitting: *Deleted

## 2017-05-13 MED ORDER — CLOBETASOL PROPIONATE 0.05 % EX CREA: 1.0000 "application " | TOPICAL_CREAM | Freq: Two times a day (BID) | CUTANEOUS | 2 refills | Status: DC

## 2017-05-13 NOTE — Telephone Encounter (Signed)
Rx sent 

## 2017-05-13 NOTE — Telephone Encounter (Signed)
Okay to refill as needed x2

## 2017-05-13 NOTE — Telephone Encounter (Signed)
Per note on 05/19/16 "Lichen sclerosus, biopsy-proven earlier this year. Using clobetasol twice weekly with complete symptom relief. Has supply at home but will call if needs more.  Any refills?

## 2017-05-16 ENCOUNTER — Other Ambulatory Visit: Payer: Self-pay

## 2017-05-16 MED ORDER — CLOBETASOL PROPIONATE 0.05 % EX CREA: 1.0000 "application " | TOPICAL_CREAM | Freq: Two times a day (BID) | CUTANEOUS | 0 refills | Status: DC

## 2017-05-16 NOTE — Telephone Encounter (Signed)
Annual exam scheduled 06/03/17.

## 2017-06-03 ENCOUNTER — Ambulatory Visit: Payer: Medicare Other | Admitting: Gynecology

## 2017-06-03 ENCOUNTER — Encounter: Payer: Self-pay | Admitting: Gynecology

## 2017-06-03 VITALS — BP 120/74 | Ht 59.0 in | Wt 104.0 lb

## 2017-06-03 DIAGNOSIS — M8580 Other specified disorders of bone density and structure, unspecified site: Secondary | ICD-10-CM

## 2017-06-03 DIAGNOSIS — L900 Lichen sclerosus et atrophicus: Secondary | ICD-10-CM | POA: Diagnosis not present

## 2017-06-03 DIAGNOSIS — N952 Postmenopausal atrophic vaginitis: Secondary | ICD-10-CM | POA: Diagnosis not present

## 2017-06-03 DIAGNOSIS — Z01411 Encounter for gynecological examination (general) (routine) with abnormal findings: Secondary | ICD-10-CM | POA: Diagnosis not present

## 2017-06-03 NOTE — Patient Instructions (Signed)
Follow-up in 1 year for annual exam, sooner if any issues. 

## 2017-06-03 NOTE — Progress Notes (Signed)
    Wendy Lewis 09/30/1940 [id number]        77 y.o.  G2P2002 for annual gynecologic exam.  Using clobetasol for lichen sclerosus intermittently with good results.  Past medical history,surgical history, problem list, medications, allergies, family history and social history were all reviewed and documented as reviewed in the EPIC chart.  ROS:  Performed with pertinent positives and negatives included in the history, assessment and plan.   Additional significant findings : None   Exam: Kennon PortelaKim Gardner assistant Vitals:   06/03/17 1417  BP: 120/74  Weight: 104 lb (47.2 kg)  Height: 4\' 11"  (1.499 m)   Body mass index is 21.01 kg/m.  General appearance:  Normal affect, orientation and appearance. Skin: Grossly normal HEENT: Without gross lesions.  No cervical or supraclavicular adenopathy. Thyroid normal.  Lungs:  Clear without wheezing, rales or rhonchi Cardiac: RR, without RMG Abdominal:  Soft, nontender, without masses, guarding, rebound, organomegaly or hernia Breasts:  Examined lying and sitting without masses, retractions, discharge or axillary adenopathy. Pelvic:  Ext, BUS, Vagina: With atrophic changes.  Adnexa: Without masses or tenderness    Anus and perineum: Normal   Rectovaginal: Normal sphincter tone without palpated masses or tenderness.    Assessment/Plan:  77 y.o. 562P2002 female for annual gynecologic exam.   1. Postmenopausal.  Status post TAH in the past for cervical dysplasia.  Doing well without significant hot flushes, night sweats or vaginal dryness. 2. Lichen sclerosis.  Biopsy-proven last year.  Doing well with intermittent clobetasol cream for irritative symptoms.  Has supply at home and will call if she needs more. 3. Pap smear 2018.  No Pap smear done today.  Options to stop screening based on age and hysterectomy history per current screening guidelines reviewed.  Will readdress on an annual basis. 4. Mammography 02/2017.  Continue with annual  mammography when due.  Breast exam normal today. 5. DEXA 2017 T score -1.6 FRAX 10% / 2%.  Recommend schedule DEXA now a 2-year interval and she agrees to do so. 6. Colonoscopy 2009.  Was told that she no longer needs colonoscopy.  She will follow-up with her primary for colon screening recommendations. 7. Health maintenance.  No routine lab work done as patient reports is done elsewhere.  Follow-up for DEXA.  Follow-up for annual exam in 1 year.    Dara Lordsimothy P Fontaine MD, 2:37 PM 06/03/2017

## 2017-06-29 ENCOUNTER — Other Ambulatory Visit: Payer: Self-pay

## 2017-06-29 ENCOUNTER — Encounter (HOSPITAL_COMMUNITY): Payer: Self-pay

## 2017-06-29 ENCOUNTER — Emergency Department (HOSPITAL_COMMUNITY)
Admission: EM | Admit: 2017-06-29 | Discharge: 2017-06-30 | Disposition: A | Discharge status: Home / Self Care | Payer: Medicare Other | Attending: Emergency Medicine | Admitting: Emergency Medicine

## 2017-06-29 ENCOUNTER — Emergency Department (HOSPITAL_COMMUNITY): Payer: Medicare Other

## 2017-06-29 DIAGNOSIS — Z7982 Long term (current) use of aspirin: Secondary | ICD-10-CM | POA: Insufficient documentation

## 2017-06-29 DIAGNOSIS — B349 Viral infection, unspecified: Secondary | ICD-10-CM | POA: Diagnosis not present

## 2017-06-29 DIAGNOSIS — Z87891 Personal history of nicotine dependence: Secondary | ICD-10-CM | POA: Diagnosis not present

## 2017-06-29 DIAGNOSIS — I10 Essential (primary) hypertension: Secondary | ICD-10-CM | POA: Insufficient documentation

## 2017-06-29 DIAGNOSIS — Z79899 Other long term (current) drug therapy: Secondary | ICD-10-CM | POA: Insufficient documentation

## 2017-06-29 DIAGNOSIS — R69 Illness, unspecified: Secondary | ICD-10-CM | POA: Diagnosis present

## 2017-06-29 LAB — CBC WITH DIFFERENTIAL/PLATELET
BASOS PCT: 0 %
Basophils Absolute: 0 10*3/uL (ref 0.0–0.1)
EOS ABS: 0 10*3/uL (ref 0.0–0.7)
EOS PCT: 0 %
HCT: 33.8 % — ABNORMAL LOW (ref 36.0–46.0)
Hemoglobin: 11.6 g/dL — ABNORMAL LOW (ref 12.0–15.0)
Lymphocytes Relative: 6 %
Lymphs Abs: 0.5 10*3/uL — ABNORMAL LOW (ref 0.7–4.0)
MCH: 31.3 pg (ref 26.0–34.0)
MCHC: 34.3 g/dL (ref 30.0–36.0)
MCV: 91.1 fL (ref 78.0–100.0)
MONO ABS: 0.2 10*3/uL (ref 0.1–1.0)
MONOS PCT: 2 %
NEUTROS PCT: 92 %
Neutro Abs: 8.3 10*3/uL — ABNORMAL HIGH (ref 1.7–7.7)
PLATELETS: 215 10*3/uL (ref 150–400)
RBC: 3.71 MIL/uL — ABNORMAL LOW (ref 3.87–5.11)
RDW: 12.7 % (ref 11.5–15.5)
WBC: 9 10*3/uL (ref 4.0–10.5)

## 2017-06-29 LAB — COMPREHENSIVE METABOLIC PANEL
ALT: 20 U/L (ref 14–54)
ANION GAP: 11 (ref 5–15)
AST: 37 U/L (ref 15–41)
Albumin: 4 g/dL (ref 3.5–5.0)
Alkaline Phosphatase: 78 U/L (ref 38–126)
BUN: 20 mg/dL (ref 6–20)
CO2: 25 mmol/L (ref 22–32)
Calcium: 9 mg/dL (ref 8.9–10.3)
Chloride: 97 mmol/L — ABNORMAL LOW (ref 101–111)
Creatinine, Ser: 1.33 mg/dL — ABNORMAL HIGH (ref 0.44–1.00)
GFR calc non Af Amer: 38 mL/min — ABNORMAL LOW (ref >60)
GFR, EST AFRICAN AMERICAN: 44 mL/min — AB (ref >60)
Glucose, Bld: 149 mg/dL — ABNORMAL HIGH (ref 65–99)
POTASSIUM: 4.2 mmol/L (ref 3.5–5.1)
SODIUM: 133 mmol/L — AB (ref 135–145)
TOTAL PROTEIN: 7.6 g/dL (ref 6.5–8.1)
Total Bilirubin: 1 mg/dL (ref 0.3–1.2)

## 2017-06-29 LAB — INFLUENZA PANEL BY PCR (TYPE A & B)
INFLAPCR: NEGATIVE
Influenza B By PCR: NEGATIVE

## 2017-06-29 MED ORDER — KETOROLAC TROMETHAMINE 30 MG/ML IJ SOLN
15.0000 mg | Freq: Once | INTRAMUSCULAR | Status: AC
  Administered 2017-06-29: 15 mg via INTRAVENOUS
  Filled 2017-06-29: qty 1

## 2017-06-29 MED ORDER — ONDANSETRON 4 MG PO TBDP: ORAL_TABLET | ORAL | 0 refills | Status: DC

## 2017-06-29 MED ORDER — ONDANSETRON HCL 4 MG/2ML IJ SOLN
4.0000 mg | Freq: Once | INTRAMUSCULAR | Status: AC
Start: 2017-06-29 — End: 2017-06-29
  Administered 2017-06-29: 4 mg via INTRAVENOUS
  Filled 2017-06-29: qty 2

## 2017-06-29 MED ORDER — SODIUM CHLORIDE 0.9 % IV BOLUS (SEPSIS)
1000.0000 mL | Freq: Once | INTRAVENOUS | Status: AC
  Administered 2017-06-29: 1000 mL via INTRAVENOUS

## 2017-06-29 NOTE — ED Provider Notes (Signed)
[facility]-EMERGENCY DEPT Provider Note   CSN: [id number] Arrival date & time: 06/29/17  2017     History   Chief Complaint Chief Complaint  Patient presents with  . Flu like symptoms    HPI Wendy Lewis is a 77 y.o. female.  Patient complains of nausea vomiting myalgias and cough.   The history is provided by the patient. No language interpreter was used.  Illness  This is a new problem. The current episode started 2 days ago. The problem occurs constantly. The problem has not changed since onset.Pertinent negatives include no chest pain, no abdominal pain and no headaches. Nothing aggravates the symptoms. Nothing relieves the symptoms. She has tried nothing for the symptoms.    Past Medical History:  Diagnosis Date  . Bursitis   . CP (cerebral palsy) (HCC)   . Hypertension   . Lichen sclerosus 05/2015   vulvar biopsy  . Osteopenia 06/2015   T score -1.6 FRAX 10%/2%    Patient Active Problem List   Diagnosis Date Noted  . Atrophic vaginitis   . CP (cerebral palsy) (HCC)   . Hypertension     Past Surgical History:  Procedure Laterality Date  . ABDOMINAL HYSTERECTOMY  1975   TAH for cervical dysplasia  . CATARACT EXTRACTION     Bilateral  . TONSILLECTOMY      OB History    Gravida Para Term Preterm AB Living   2 2 2     2    SAB TAB Ectopic Multiple Live Births                   Home Medications    Prior to Admission medications   Medication Sig Start Date End Date Taking? Authorizing Provider  aspirin 81 MG tablet Take 81 mg by mouth daily.     Yes [provider]  Chlorpheniramine-APAP (CORICIDIN) 2-325 MG TABS Take 1 tablet by mouth as needed (cold).   Yes [provider]  Cholecalciferol (VITAMIN D PO) Take 1 tablet by mouth daily.    Yes [provider]  lisinopril-hydrochlorothiazide (PRINZIDE,ZESTORETIC) 20-25 MG tablet Take 1 tablet by mouth daily.   Yes [provider]  Multiple  Vitamin (MULTIVITAMIN WITH MINERALS) TABS tablet Take 1 tablet by mouth daily.   Yes [provider]  clobetasol cream (TEMOVATE) 0.05 % Apply 1 application topically 2 (two) times daily. Patient not taking: Reported on 06/29/2017 05/16/17   Fontaine, Nadyne Coombes, MD  nystatin cream (MYCOSTATIN) Apply 1 application topically 2 (two) times daily. Patient not taking: Reported on 06/29/2017 05/15/15   [name], MD  ondansetron (ZOFRAN ODT) 4 MG disintegrating tablet 4mg  ODT q4 hours prn nausea/vomit 06/29/17   Bethann Berkshire, MD    Family History Family History  Problem Relation Age of Onset  . Diabetes Mother   . Hypertension Mother   . Heart disease Mother   . Cancer Sister        THYROID  . Cancer Paternal Grandmother        UTERINE OR OVARIAN?  Marland Kitchen Hypertension Daughter     Social History Social History   Tobacco Use  . Smoking status: Former Games developer  . Smokeless tobacco: Never Used  Substance Use Topics  . Alcohol use: No    Alcohol/week: 0.0 oz  . Drug use: No     Allergies   Patient has no known allergies.   Review of Systems Review of Systems  Constitutional: Negative for appetite  change and fatigue.  HENT: Negative for congestion, ear discharge and sinus pressure.   Eyes: Negative for discharge.  Respiratory: Negative for cough.   Cardiovascular: Negative for chest pain.  Gastrointestinal: Positive for diarrhea and nausea. Negative for abdominal pain.  Genitourinary: Negative for frequency and hematuria.  Musculoskeletal: Negative for back pain.  Skin: Negative for rash.  Neurological: Negative for seizures and headaches.  Psychiatric/Behavioral: Negative for hallucinations.     Physical Exam Updated Vital Signs BP (!) 159/91 (BP Location: Right Arm)   Pulse 98   Temp 99.8 F (37.7 C) (Oral)   Resp 19   SpO2 96%   Physical Exam  Constitutional: She is oriented to person, place, and time. She appears well-developed.  HENT:  Head:  Normocephalic.  Dry mucous membranes  Eyes: Conjunctivae and EOM are normal. No scleral icterus.  Neck: Neck supple. No thyromegaly present.  Cardiovascular: Normal rate and regular rhythm. Exam reveals no gallop and no friction rub.  No murmur heard. Pulmonary/Chest: No stridor. She has no wheezes. She has no rales. She exhibits no tenderness.  Abdominal: She exhibits no distension. There is no tenderness. There is no rebound.  Musculoskeletal: Normal range of motion. She exhibits no edema.  Lymphadenopathy:    She has no cervical adenopathy.  Neurological: She is oriented to person, place, and time. She exhibits normal muscle tone. Coordination normal.  Skin: No rash noted. No erythema.  Psychiatric: She has a normal mood and affect. Her behavior is normal.     ED Treatments / Results  Labs (all labs ordered are listed, but only abnormal results are displayed) Labs Reviewed  CBC WITH DIFFERENTIAL/PLATELET - Abnormal; Notable for the following components:      Result Value   RBC 3.71 (*)    Hemoglobin 11.6 (*)    HCT 33.8 (*)    Neutro Abs 8.3 (*)    Lymphs Abs 0.5 (*)    All other components within normal limits  COMPREHENSIVE METABOLIC PANEL - Abnormal; Notable for the following components:   Sodium 133 (*)    Chloride 97 (*)    Glucose, Bld 149 (*)    Creatinine, Ser 1.33 (*)    GFR calc non Af Amer 38 (*)    GFR calc Af Amer 44 (*)    All other components within normal limits  INFLUENZA PANEL BY PCR (TYPE A & B)    EKG  EKG Interpretation None       Radiology Dg Chest Port 1 View  Result Date: 06/29/2017 CLINICAL DATA:  Shortness of breath.  Flu-like symptoms. EXAM: PORTABLE CHEST 1 VIEW COMPARISON:  Radiographs and CT 03/01/2017 FINDINGS: The cardiomediastinal contours are unchanged with aortic tortuosity. Heart is normal in size. Pulmonary vasculature is normal. No consolidation, pleural effusion, or pneumothorax. No acute osseous abnormalities are seen.  IMPRESSION: No acute abnormality. Electronically Signed   By: Rubye OaksMelanie  Ehinger M.D.   On: 06/29/2017 21:31    Procedures Procedures (including critical care time)  Medications Ordered in ED Medications  sodium chloride 0.9 % bolus 1,000 mL (1,000 mLs Intravenous New Bag/Given 06/29/17 2133)  ondansetron (ZOFRAN) injection 4 mg (4 mg Intravenous Given 06/29/17 2133)  ketorolac (TORADOL) 30 MG/ML injection 15 mg (15 mg Intravenous Given 06/29/17 2134)     Initial Impression / Assessment and Plan / ED Course  I have reviewed the triage vital signs and the nursing notes.  Pertinent labs & imaging results that were available during my care of the patient  were reviewed by me and considered in my medical decision making (see chart for details).     Patient with viral syndrome.  Patient improved with IV fluids.  She will be discharged home with Zofran and told to take Tylenol follow-up with her PCP  Final Clinical Impressions(s) / ED Diagnoses   Final diagnoses:  Viral syndrome    ED Discharge Orders        Ordered    ondansetron (ZOFRAN ODT) 4 MG disintegrating tablet     06/29/17 2323       Bethann Berkshire, MD 06/29/17 2326

## 2017-06-29 NOTE — Discharge Instructions (Signed)
Drink plenty of fluids take Tylenol for fever and aches and follow-up with your family doctor next week if not improving

## 2017-06-29 NOTE — ED Triage Notes (Signed)
[facility] with complaints of flu type symptoms. Symptoms started last Sunday-low grade fever-runny nose and now nausea and vomiting. Patient lives by herself. BP 138/58 HR 100 CBG 161 O2 sat 96% RA.

## 2017-07-03 ENCOUNTER — Inpatient Hospital Stay (HOSPITAL_COMMUNITY)
Admission: EM | Admit: 2017-07-03 | Discharge: 2017-07-08 | DRG: 394 | Disposition: A | Discharge status: Home / Self Care | Payer: Medicare Other | Attending: Internal Medicine | Admitting: Internal Medicine

## 2017-07-03 ENCOUNTER — Encounter (HOSPITAL_COMMUNITY): Payer: Self-pay

## 2017-07-03 ENCOUNTER — Emergency Department (HOSPITAL_COMMUNITY): Payer: Medicare Other

## 2017-07-03 ENCOUNTER — Other Ambulatory Visit: Payer: Self-pay

## 2017-07-03 DIAGNOSIS — M8580 Other specified disorders of bone density and structure, unspecified site: Secondary | ICD-10-CM | POA: Diagnosis present

## 2017-07-03 DIAGNOSIS — E876 Hypokalemia: Secondary | ICD-10-CM | POA: Diagnosis present

## 2017-07-03 DIAGNOSIS — Z7982 Long term (current) use of aspirin: Secondary | ICD-10-CM

## 2017-07-03 DIAGNOSIS — K529 Noninfective gastroenteritis and colitis, unspecified: Secondary | ICD-10-CM | POA: Diagnosis not present

## 2017-07-03 DIAGNOSIS — Z9842 Cataract extraction status, left eye: Secondary | ICD-10-CM

## 2017-07-03 DIAGNOSIS — G809 Cerebral palsy, unspecified: Secondary | ICD-10-CM | POA: Diagnosis present

## 2017-07-03 DIAGNOSIS — N179 Acute kidney failure, unspecified: Secondary | ICD-10-CM | POA: Diagnosis present

## 2017-07-03 DIAGNOSIS — Z90710 Acquired absence of both cervix and uterus: Secondary | ICD-10-CM

## 2017-07-03 DIAGNOSIS — Z9841 Cataract extraction status, right eye: Secondary | ICD-10-CM

## 2017-07-03 DIAGNOSIS — D649 Anemia, unspecified: Secondary | ICD-10-CM | POA: Diagnosis present

## 2017-07-03 DIAGNOSIS — I10 Essential (primary) hypertension: Secondary | ICD-10-CM | POA: Diagnosis not present

## 2017-07-03 DIAGNOSIS — Z79899 Other long term (current) drug therapy: Secondary | ICD-10-CM

## 2017-07-03 DIAGNOSIS — R05 Cough: Secondary | ICD-10-CM | POA: Diagnosis present

## 2017-07-03 DIAGNOSIS — L900 Lichen sclerosus et atrophicus: Secondary | ICD-10-CM | POA: Diagnosis present

## 2017-07-03 DIAGNOSIS — Z8249 Family history of ischemic heart disease and other diseases of the circulatory system: Secondary | ICD-10-CM

## 2017-07-03 DIAGNOSIS — K55039 Acute (reversible) ischemia of large intestine, extent unspecified: Secondary | ICD-10-CM | POA: Diagnosis not present

## 2017-07-03 DIAGNOSIS — Z87891 Personal history of nicotine dependence: Secondary | ICD-10-CM

## 2017-07-03 DIAGNOSIS — Z87410 Personal history of cervical dysplasia: Secondary | ICD-10-CM

## 2017-07-03 DIAGNOSIS — R1030 Lower abdominal pain, unspecified: Secondary | ICD-10-CM | POA: Diagnosis not present

## 2017-07-03 DIAGNOSIS — I471 Supraventricular tachycardia: Secondary | ICD-10-CM | POA: Diagnosis present

## 2017-07-03 LAB — COMPREHENSIVE METABOLIC PANEL
ALT: 47 U/L (ref 14–54)
AST: 70 U/L — AB (ref 15–41)
Albumin: 3.7 g/dL (ref 3.5–5.0)
Alkaline Phosphatase: 88 U/L (ref 38–126)
Anion gap: 12 (ref 5–15)
BUN: 14 mg/dL (ref 6–20)
CO2: 28 mmol/L (ref 22–32)
CREATININE: 1.38 mg/dL — AB (ref 0.44–1.00)
Calcium: 9.2 mg/dL (ref 8.9–10.3)
Chloride: 95 mmol/L — ABNORMAL LOW (ref 101–111)
GFR calc non Af Amer: 36 mL/min — ABNORMAL LOW (ref >60)
GFR, EST AFRICAN AMERICAN: 42 mL/min — AB (ref >60)
Glucose, Bld: 119 mg/dL — ABNORMAL HIGH (ref 65–99)
Potassium: 3.5 mmol/L (ref 3.5–5.1)
SODIUM: 135 mmol/L (ref 135–145)
Total Bilirubin: 0.7 mg/dL (ref 0.3–1.2)
Total Protein: 7.1 g/dL (ref 6.5–8.1)

## 2017-07-03 LAB — URINALYSIS, ROUTINE W REFLEX MICROSCOPIC
BILIRUBIN URINE: NEGATIVE
Bacteria, UA: NONE SEEN
GLUCOSE, UA: NEGATIVE mg/dL
HGB URINE DIPSTICK: NEGATIVE
Ketones, ur: 5 mg/dL — AB
Nitrite: NEGATIVE
PH: 5 (ref 5.0–8.0)
Protein, ur: NEGATIVE mg/dL
SPECIFIC GRAVITY, URINE: 1.02 (ref 1.005–1.030)

## 2017-07-03 LAB — CBC WITH DIFFERENTIAL/PLATELET
BASOS ABS: 0 10*3/uL (ref 0.0–0.1)
BASOS PCT: 0 %
EOS PCT: 0 %
Eosinophils Absolute: 0 10*3/uL (ref 0.0–0.7)
HCT: 33.3 % — ABNORMAL LOW (ref 36.0–46.0)
Hemoglobin: 11.7 g/dL — ABNORMAL LOW (ref 12.0–15.0)
Lymphocytes Relative: 13 %
Lymphs Abs: 1.2 10*3/uL (ref 0.7–4.0)
MCH: 31.1 pg (ref 26.0–34.0)
MCHC: 35.1 g/dL (ref 30.0–36.0)
MCV: 88.6 fL (ref 78.0–100.0)
Monocytes Absolute: 0.6 10*3/uL (ref 0.1–1.0)
Monocytes Relative: 7 %
Neutro Abs: 7.4 10*3/uL (ref 1.7–7.7)
Neutrophils Relative %: 80 %
Platelets: 229 10*3/uL (ref 150–400)
RBC: 3.76 MIL/uL — AB (ref 3.87–5.11)
RDW: 12.6 % (ref 11.5–15.5)
WBC: 9.2 10*3/uL (ref 4.0–10.5)

## 2017-07-03 LAB — C DIFFICILE QUICK SCREEN W PCR REFLEX
C Diff antigen: NEGATIVE
C Diff interpretation: NOT DETECTED
C Diff toxin: NEGATIVE

## 2017-07-03 LAB — LIPASE, BLOOD: Lipase: 24 U/L (ref 11–51)

## 2017-07-03 LAB — I-STAT CG4 LACTIC ACID, ED: LACTIC ACID, VENOUS: 1.4 mmol/L (ref 0.5–1.9)

## 2017-07-03 MED ORDER — BOOST / RESOURCE BREEZE PO LIQD CUSTOM
1.0000 | Freq: Three times a day (TID) | ORAL | Status: DC
  Administered 2017-07-03 – 2017-07-04 (×2): 1 via ORAL

## 2017-07-03 MED ORDER — SODIUM CHLORIDE 0.9 % IV SOLN
INTRAVENOUS | Status: DC
  Administered 2017-07-03 – 2017-07-04 (×2): via INTRAVENOUS

## 2017-07-03 MED ORDER — VITAMIN D 1000 UNITS PO TABS
1000.0000 [IU] | ORAL_TABLET | Freq: Every day | ORAL | Status: DC
  Administered 2017-07-03 – 2017-07-08 (×6): 1000 [IU] via ORAL
  Filled 2017-07-03 (×6): qty 1

## 2017-07-03 MED ORDER — IOPAMIDOL (ISOVUE-300) INJECTION 61%
INTRAVENOUS | Status: AC
  Administered 2017-07-03: 30 mL via ORAL
  Filled 2017-07-03: qty 30

## 2017-07-03 MED ORDER — SODIUM CHLORIDE 0.9 % IV BOLUS (SEPSIS)
1000.0000 mL | Freq: Once | INTRAVENOUS | Status: AC
  Administered 2017-07-03: 1000 mL via INTRAVENOUS

## 2017-07-03 MED ORDER — ACETAMINOPHEN 650 MG RE SUPP: 650.0000 mg | Freq: Four times a day (QID) | RECTAL | Status: DC | PRN

## 2017-07-03 MED ORDER — METRONIDAZOLE IN NACL 5-0.79 MG/ML-% IV SOLN
500.0000 mg | Freq: Three times a day (TID) | INTRAVENOUS | Status: DC
  Administered 2017-07-03 – 2017-07-07 (×11): 500 mg via INTRAVENOUS
  Filled 2017-07-03 (×13): qty 100

## 2017-07-03 MED ORDER — IOPAMIDOL (ISOVUE-300) INJECTION 61%
80.0000 mL | Freq: Once | INTRAVENOUS | Status: AC | PRN
  Administered 2017-07-03: 80 mL via INTRAVENOUS

## 2017-07-03 MED ORDER — ONDANSETRON HCL 4 MG/2ML IJ SOLN
4.0000 mg | Freq: Once | INTRAMUSCULAR | Status: AC
  Administered 2017-07-03: 4 mg via INTRAVENOUS
  Filled 2017-07-03: qty 2

## 2017-07-03 MED ORDER — METRONIDAZOLE IN NACL 5-0.79 MG/ML-% IV SOLN
500.0000 mg | Freq: Once | INTRAVENOUS | Status: AC
  Administered 2017-07-03: 500 mg via INTRAVENOUS
  Filled 2017-07-03: qty 100

## 2017-07-03 MED ORDER — ONDANSETRON HCL 4 MG/2ML IJ SOLN
4.0000 mg | Freq: Four times a day (QID) | INTRAMUSCULAR | Status: DC | PRN
  Administered 2017-07-03 – 2017-07-05 (×2): 4 mg via INTRAVENOUS
  Filled 2017-07-03 (×2): qty 2

## 2017-07-03 MED ORDER — IOPAMIDOL (ISOVUE-300) INJECTION 61%
INTRAVENOUS | Status: AC
  Filled 2017-07-03: qty 100

## 2017-07-03 MED ORDER — ACETAMINOPHEN 325 MG PO TABS
650.0000 mg | ORAL_TABLET | Freq: Four times a day (QID) | ORAL | Status: DC | PRN
  Administered 2017-07-03 – 2017-07-07 (×3): 650 mg via ORAL
  Filled 2017-07-03 (×3): qty 2

## 2017-07-03 MED ORDER — ENOXAPARIN SODIUM 30 MG/0.3ML ~~LOC~~ SOLN
30.0000 mg | SUBCUTANEOUS | Status: DC
  Administered 2017-07-04: 30 mg via SUBCUTANEOUS
  Filled 2017-07-03 (×2): qty 0.3

## 2017-07-03 MED ORDER — ASPIRIN EC 81 MG PO TBEC
81.0000 mg | DELAYED_RELEASE_TABLET | Freq: Every day | ORAL | Status: DC
  Administered 2017-07-03 – 2017-07-08 (×6): 81 mg via ORAL
  Filled 2017-07-03 (×6): qty 1

## 2017-07-03 MED ORDER — LISINOPRIL-HYDROCHLOROTHIAZIDE 20-25 MG PO TABS: 1.0000 | ORAL_TABLET | Freq: Every day | ORAL | Status: DC

## 2017-07-03 MED ORDER — CIPROFLOXACIN IN D5W 400 MG/200ML IV SOLN
400.0000 mg | Freq: Once | INTRAVENOUS | Status: AC
  Administered 2017-07-03: 400 mg via INTRAVENOUS
  Filled 2017-07-03: qty 200

## 2017-07-03 MED ORDER — SODIUM CHLORIDE 0.9 % IV SOLN
2.0000 g | INTRAVENOUS | Status: DC
  Administered 2017-07-04 – 2017-07-06 (×3): 2 g via INTRAVENOUS
  Filled 2017-07-03 (×2): qty 2
  Filled 2017-07-03: qty 20
  Filled 2017-07-03: qty 2

## 2017-07-03 MED ORDER — LISINOPRIL 10 MG PO TABS
10.0000 mg | ORAL_TABLET | Freq: Every day | ORAL | Status: DC
  Administered 2017-07-03 – 2017-07-04 (×2): 10 mg via ORAL
  Filled 2017-07-03 (×2): qty 1

## 2017-07-03 MED ORDER — ADULT MULTIVITAMIN W/MINERALS CH
1.0000 | ORAL_TABLET | Freq: Every day | ORAL | Status: DC
  Administered 2017-07-04 – 2017-07-08 (×5): 1 via ORAL
  Filled 2017-07-03 (×5): qty 1

## 2017-07-03 NOTE — ED Triage Notes (Signed)
She states she was seen here ~ a week ago for n/v. She is here today with c/o persistent nausea and anorexia. She is in no distress. She is here with her daughter.

## 2017-07-03 NOTE — ED Provider Notes (Signed)
Wendy Lewis   CSN: [id number] Arrival date & time: 07/03/17  4098     History   Chief Complaint Chief Complaint  Patient presents with  . Nausea  . Anorexia    HPI Wendy Lewis is a 77 y.o. female.  HPI   77 year old pleasant female with history of cerebral palsy here with generalized weakness.  The patient was last seen on 2/13.  She was seen for nausea, vomiting, and general body aches with cough.  She was clear at that time.  She was sent home with supportive care.  Since then, the patient is had progressive worsening nausea, epigastric pain, and diarrhea.  She is been unable to eat or drink.  She is lost some weight over the last week.  She endorses generalized weakness and is having difficulty getting around the house.  Denies any fevers or chills.  The pain is primarily epigastric but also in the lower midline abdomen.  No urinary symptoms.  No blood in her emesis or diarrhea.  No known sick contacts.  Denies any ongoing myalgias.  Past Medical History:  Diagnosis Date  . Bursitis   . CP (cerebral palsy) (HCC)   . Hypertension   . Lichen sclerosus 05/2015   vulvar biopsy  . Osteopenia 06/2015   T score -1.6 FRAX 10%/2%    Patient Active Problem List   Diagnosis Date Noted  . Colitis 07/03/2017  . Atrophic vaginitis   . CP (cerebral palsy) (HCC)   . Hypertension     Past Surgical History:  Procedure Laterality Date  . ABDOMINAL HYSTERECTOMY  1975   TAH for cervical dysplasia  . CATARACT EXTRACTION     Bilateral  . TONSILLECTOMY      OB History    Gravida Para Term Preterm AB Living   2 2 2     2    SAB TAB Ectopic Multiple Live Births                   Home Medications    Prior to Admission medications   Medication Sig Start Date End Date Taking? Authorizing Provider  aspirin 81 MG tablet Take 81 mg by mouth daily.     Yes [provider]  Chlorpheniramine-APAP (CORICIDIN) 2-325 MG TABS  Take 1 tablet by mouth as needed (cold).   Yes [provider]  Cholecalciferol (VITAMIN D PO) Take 1 tablet by mouth daily.    Yes [provider]  lisinopril-hydrochlorothiazide (PRINZIDE,ZESTORETIC) 20-25 MG tablet Take 1 tablet by mouth daily.   Yes [provider]  Multiple Vitamin (MULTIVITAMIN WITH MINERALS) TABS tablet Take 1 tablet by mouth daily.   Yes [provider]  ondansetron (ZOFRAN ODT) 4 MG disintegrating tablet 4mg  ODT q4 hours prn nausea/vomit 06/29/17  Yes Bethann Berkshire, MD  clobetasol cream (TEMOVATE) 0.05 % Apply 1 application topically 2 (two) times daily. Patient not taking: Reported on 06/29/2017 05/16/17   Fontaine, Nadyne Coombes, MD  nystatin cream (MYCOSTATIN) Apply 1 application topically 2 (two) times daily. Patient not taking: Reported on 06/29/2017 05/15/15   Fontaine, Nadyne Coombes, MD    Family History Family History  Problem Relation Age of Onset  . Diabetes Mother   . Hypertension Mother   . Heart disease Mother   . Cancer Sister        THYROID  . Cancer Paternal Grandmother        UTERINE OR OVARIAN?  Marland Kitchen Hypertension Daughter  Social History Social History   Tobacco Use  . Smoking status: Former Games developer  . Smokeless tobacco: Never Used  Substance Use Topics  . Alcohol use: No    Alcohol/week: 0.0 oz  . Drug use: No     Allergies   Patient has no known allergies.   Review of Systems Review of Systems  Constitutional: Positive for fatigue. Negative for chills and fever.  HENT: Negative for congestion, rhinorrhea and sore throat.   Eyes: Negative for visual disturbance.  Respiratory: Negative for cough, shortness of breath and wheezing.   Cardiovascular: Negative for chest pain and leg swelling.  Gastrointestinal: Positive for abdominal pain, diarrhea and nausea. Negative for vomiting.  Genitourinary: Negative for dysuria, flank pain, vaginal bleeding and vaginal discharge.  Musculoskeletal: Negative for  neck pain.  Skin: Negative for rash.  Allergic/Immunologic: Negative for immunocompromised state.  Neurological: Positive for weakness. Negative for syncope and headaches.  Hematological: Does not bruise/bleed easily.  All other systems reviewed and are negative.    Physical Exam Updated Vital Signs BP 113/74 (BP Location: Left Arm)   Pulse 85   Temp 98.3 F (36.8 C) (Oral)   Resp (!) 24   SpO2 97%   Physical Exam  Constitutional: She is oriented to person, place, and time. She appears well-developed and well-nourished. No distress.  HENT:  Head: Normocephalic and atraumatic.  Dry mucous membranes  Eyes: Conjunctivae are normal.  Neck: Neck supple.  Cardiovascular: Normal rate, regular rhythm and normal heart sounds. Exam reveals no friction rub.  No murmur heard. Pulmonary/Chest: Effort normal and breath sounds normal. No respiratory distress. She has no wheezes. She has no rales.  Abdominal: Soft. Bowel sounds are normal. She exhibits no distension. There is tenderness (Epigastric and lower midline abdomen, without rebound or guarding). There is no rebound and no guarding.  Musculoskeletal: She exhibits no edema.  Neurological: She is alert and oriented to person, place, and time. She exhibits normal muscle tone.  Skin: Skin is warm. Capillary refill takes less than 2 seconds.  Psychiatric: She has a normal mood and affect.  Nursing Lewis and vitals reviewed.    ED Treatments / Results  Labs (all labs ordered are listed, but only abnormal results are displayed) Labs Reviewed  CBC WITH DIFFERENTIAL/PLATELET - Abnormal; Notable for the following components:      Result Value   RBC 3.76 (*)    Hemoglobin 11.7 (*)    HCT 33.3 (*)    All other components within normal limits  COMPREHENSIVE METABOLIC PANEL - Abnormal; Notable for the following components:   Chloride 95 (*)    Glucose, Bld 119 (*)    Creatinine, Ser 1.38 (*)    AST 70 (*)    GFR calc non Af Amer 36 (*)      GFR calc Af Amer 42 (*)    All other components within normal limits  URINALYSIS, ROUTINE W REFLEX MICROSCOPIC - Abnormal; Notable for the following components:   Ketones, ur 5 (*)    Leukocytes, UA MODERATE (*)    Squamous Epithelial / LPF 0-5 (*)    All other components within normal limits  C DIFFICILE QUICK SCREEN W PCR REFLEX  GASTROINTESTINAL PANEL BY PCR, STOOL (REPLACES STOOL CULTURE)  LIPASE, BLOOD  I-STAT CG4 LACTIC ACID, ED    EKG  EKG Interpretation  Date/Time:  Sunday July 03 2017 10:13:07 EST Ventricular Rate:  85 PR Interval:    QRS Duration: 96 QT Interval:  314 QTC  Calculation: 374 R Axis:   0 Text Interpretation:  Sinus rhythm likely Significant baseline artifact noted Low voltage, extremity and precordial leads Nonspecific T abnormalities, lateral leads Confirmed by Shaune Pollack [phone]) on 07/03/2017 3:58:57 PM       Radiology Ct Abdomen Pelvis W Contrast  Result Date: 07/03/2017 CLINICAL DATA:  Persistent nausea and vomiting. EXAM: CT ABDOMEN AND PELVIS WITH CONTRAST TECHNIQUE: Multidetector CT imaging of the abdomen and pelvis was performed using the standard protocol following bolus administration of intravenous contrast. CONTRAST:  80mL ISOVUE-300 IOPAMIDOL (ISOVUE-300) INJECTION 61%, 30mL ISOVUE-300 IOPAMIDOL (ISOVUE-300) INJECTION 61% COMPARISON:  None. FINDINGS: Lower chest: No acute abnormality. Hepatobiliary: Subcentimeter low-density lesion in the left hepatic lobe is too small to characterize. No other focal liver abnormality. Gallbladder is unremarkable. No biliary dilatation. Pancreas: Unremarkable. No pancreatic ductal dilatation or surrounding inflammatory changes. Spleen: Normal in size without focal abnormality. Adrenals/Urinary Tract: The adrenal glands are unremarkable. 1.1 cm cyst in the midpole of the left kidney. The kidneys are otherwise unremarkable. No renal or ureteral calculi. No hydronephrosis. The bladder is unremarkable.  Stomach/Bowel: The stomach and small bowel are unremarkable. There is mild wall thickening and stranding surrounding the mid ascending colon. No bowel obstruction. The appendix is not identified. Vascular/Lymphatic: Aortic atherosclerosis. No enlarged abdominal or pelvic lymph nodes. Reproductive: Status post hysterectomy. The right ovary is unremarkable. There is a 1.5 cm cystic lesion in the left ovary, benign. Other: Trace free fluid in the right paracolic gutter and pelvis. No drainable fluid collection or pneumoperitoneum. Musculoskeletal: No acute or significant osseous findings. Degenerative changes of the lower lumbar spine. IMPRESSION: 1. Focal wall thickening of the mid ascending colon with surrounding inflammatory changes is suspicious for inflammatory or infectious colitis. In the non-urgent setting following complete resolution of the patient's symptoms, colonoscopy may be warranted to exclude the unlikely possibility of an underlying neoplastic lesion. 2.  Aortic atherosclerosis (ICD10-I70.0). Electronically Signed   By: Obie Dredge M.D.   On: 07/03/2017 11:29    Procedures Procedures (including critical care time)  Medications Ordered in ED Medications  iopamidol (ISOVUE-300) 61 % injection (not administered)  metroNIDAZOLE (FLAGYL) IVPB 500 mg (500 mg Intravenous New Bag/Given 07/03/17 1514)  aspirin tablet 81 mg (not administered)  cholecalciferol (VITAMIN D) tablet 1,000 Units (not administered)  multivitamin with minerals tablet 1 tablet (not administered)  enoxaparin (LOVENOX) injection 30 mg (not administered)  acetaminophen (TYLENOL) tablet 650 mg (not administered)    Or  acetaminophen (TYLENOL) suppository 650 mg (not administered)  0.9 %  sodium chloride infusion (not administered)  metroNIDAZOLE (FLAGYL) IVPB 500 mg (not administered)  lisinopril (PRINIVIL,ZESTRIL) tablet 10 mg (not administered)  cefTRIAXone (ROCEPHIN) 2 g in sodium chloride 0.9 % 100 mL IVPB (not  administered)  sodium chloride 0.9 % bolus 1,000 mL (0 mLs Intravenous Stopped 07/03/17 1142)  ondansetron (ZOFRAN) injection 4 mg (4 mg Intravenous Given 07/03/17 1026)  iopamidol (ISOVUE-300) 61 % injection (30 mLs Oral Contrast Given 07/03/17 1109)  iopamidol (ISOVUE-300) 61 % injection 80 mL (80 mLs Intravenous Contrast Given 07/03/17 1108)  ciprofloxacin (CIPRO) IVPB 400 mg (0 mg Intravenous Stopped 07/03/17 1400)     Initial Impression / Assessment and Plan / ED Course  I have reviewed the triage vital signs and the nursing notes.  Pertinent labs & imaging results that were available during my care of the patient were reviewed by me and considered in my medical decision making (see chart for details).    77 year old female here with  persistent nausea, vomiting, diarrhea, and difficulty getting around the house.  She appears clinically dehydrated.  Lab work is reassuring.  CT scan shows focal infectious or inflammatory colitis.  Patient remains nauseous and cannot tolerate p.o. in the ED.  Given her age and the fact that she lives alone with baseline COPD and significant risk factors for deconditioning, discussed management options with the family and patient does not feel comfortable returning home, which I feel is reasonable.  Will admit for IV fluids and further treatment.   Final Clinical Impressions(s) / ED Diagnoses   Final diagnoses:  Colitis    ED Discharge Orders    None       Shaune PollackIsaacs, Durrell Barajas, MD 07/03/17 1606

## 2017-07-03 NOTE — ED Notes (Signed)
ED TO INPATIENT HANDOFF REPORT  Name/Age/Gender Wendy Lewis 77 y.o. female  Code Status    Code Status Orders  (From admission, onward)        Start     Ordered   07/03/17 1353  Full code  Continuous     07/03/17 1354    Code Status History    Date Active Date Inactive Code Status Order ID Comments User Context   This patient has a current code status but no historical code status.      Home/SNF/Other Home  Chief Complaint flu like symptoms  Level of Care/Admitting Diagnosis ED Disposition    ED Disposition Condition [facility] Area: [facility] [[id number]]  Level of Care: Med-Surg [16]  Diagnosis: Colitis [937342]  Admitting Physician: Kinnie Feil [[id number]]  Attending Physician: Kinnie Feil [[id number]]  PT Class (Do Not Modify): Observation [104]  PT Acc Code (Do Not Modify): Observation [10022]       Medical History Past Medical History:  Diagnosis Date  . Bursitis   . CP (cerebral palsy) ([facility])   . Hypertension   . Lichen sclerosus 09/9739   vulvar biopsy  . Osteopenia 06/2015   T score -1.6 FRAX 10%/2%    Allergies No Known Allergies  IV Location/Drains/Wounds Patient Lines/Drains/Airways Status   Active Line/Drains/Airways    Name:   Placement date:   Placement time:   Site:   Days:   Peripheral IV 07/03/17 Right Antecubital   07/03/17    0953    Antecubital   less than 1          Labs/Imaging Results for orders placed or performed during the hospital encounter of 07/03/17 (from the past 48 hour(s))  CBC with Differential     Status: Abnormal   Collection Time: 07/03/17  9:29 AM  Result Value Ref Range   WBC 9.2 4.0 - 10.5 K/uL   RBC 3.76 (L) 3.87 - 5.11 MIL/uL   Hemoglobin 11.7 (L) 12.0 - 15.0 g/dL   HCT 33.3 (L) 36.0 - 46.0 %   MCV 88.6 78.0 - 100.0 fL   MCH 31.1 26.0 - 34.0 pg   MCHC 35.1 30.0 - 36.0 g/dL   RDW 12.6 11.5 - 15.5 %   Platelets 229 150 - 400 K/uL   Neutrophils Relative  % 80 %   Neutro Abs 7.4 1.7 - 7.7 K/uL   Lymphocytes Relative 13 %   Lymphs Abs 1.2 0.7 - 4.0 K/uL   Monocytes Relative 7 %   Monocytes Absolute 0.6 0.1 - 1.0 K/uL   Eosinophils Relative 0 %   Eosinophils Absolute 0.0 0.0 - 0.7 K/uL   Basophils Relative 0 %   Basophils Absolute 0.0 0.0 - 0.1 K/uL    Comment: Performed at [facility], [place] [address], [place], [place] [address]  Comprehensive metabolic panel     Status: Abnormal   Collection Time: 07/03/17  9:29 AM  Result Value Ref Range   Sodium 135 135 - 145 mmol/L   Potassium 3.5 3.5 - 5.1 mmol/L   Chloride 95 (L) 101 - 111 mmol/L   CO2 28 22 - 32 mmol/L   Glucose, Bld 119 (H) 65 - 99 mg/dL   BUN 14 6 - 20 mg/dL   Creatinine, Ser 1.38 (H) 0.44 - 1.00 mg/dL   Calcium 9.2 8.9 - 10.3 mg/dL   Total Protein 7.1 6.5 - 8.1 g/dL   Albumin 3.7 3.5 -  5.0 g/dL   AST 70 (H) 15 - 41 U/L   ALT 47 14 - 54 U/L   Alkaline Phosphatase 88 38 - 126 U/L   Total Bilirubin 0.7 0.3 - 1.2 mg/dL   GFR calc non Af Amer 36 (L) >60 mL/min   GFR calc Af Amer 42 (L) >60 mL/min    Comment: (NOTE) The eGFR has been calculated using the CKD EPI equation. This calculation has not been validated in all clinical situations. eGFR's persistently <60 mL/min signify possible Chronic Kidney Disease.    Anion gap 12 5 - 15    Comment: Performed at [facility], [place] [address] [place], [place] [address]  Lipase, blood     Status: None   Collection Time: 07/03/17  9:29 AM  Result Value Ref Range   Lipase 24 11 - 51 U/L    Comment: Performed at [facility], [place] [address] [place], [place] [address]  I-Stat CG4 Lactic Acid, ED     Status: None   Collection Time: 07/03/17  9:45 AM  Result Value Ref Range   Lactic Acid, Venous 1.40 0.5 - 1.9 mmol/L  Urinalysis, Routine w reflex microscopic     Status: Abnormal   Collection Time: 07/03/17 11:45 AM  Result Value Ref Range   Color, Urine YELLOW YELLOW    APPearance CLEAR CLEAR   Specific Gravity, Urine 1.020 1.005 - 1.030   pH 5.0 5.0 - 8.0   Glucose, UA NEGATIVE NEGATIVE mg/dL   Hgb urine dipstick NEGATIVE NEGATIVE   Bilirubin Urine NEGATIVE NEGATIVE   Ketones, ur 5 (A) NEGATIVE mg/dL   Protein, ur NEGATIVE NEGATIVE mg/dL   Nitrite NEGATIVE NEGATIVE   Leukocytes, UA MODERATE (A) NEGATIVE   RBC / HPF 0-5 0 - 5 RBC/hpf   WBC, UA 6-30 0 - 5 WBC/hpf   Bacteria, UA NONE SEEN NONE SEEN   Squamous Epithelial / LPF 0-5 (A) NONE SEEN   Mucus PRESENT    Hyaline Casts, UA PRESENT     Comment: Performed at [facility], [place] [address] [place], [place] [address]   Ct Abdomen Pelvis W Contrast  Result Date: 07/03/2017 CLINICAL DATA:  Persistent nausea and vomiting. EXAM: CT ABDOMEN AND PELVIS WITH CONTRAST TECHNIQUE: Multidetector CT imaging of the abdomen and pelvis was performed using the standard protocol following bolus administration of intravenous contrast. CONTRAST:  35m ISOVUE-300 IOPAMIDOL (ISOVUE-300) INJECTION 61%, 361mISOVUE-300 IOPAMIDOL (ISOVUE-300) INJECTION 61% COMPARISON:  None. FINDINGS: Lower chest: No acute abnormality. Hepatobiliary: Subcentimeter low-density lesion in the left hepatic lobe is too small to characterize. No other focal liver abnormality. Gallbladder is unremarkable. No biliary dilatation. Pancreas: Unremarkable. No pancreatic ductal dilatation or surrounding inflammatory changes. Spleen: Normal in size without focal abnormality. Adrenals/Urinary Tract: The adrenal glands are unremarkable. 1.1 cm cyst in the midpole of the left kidney. The kidneys are otherwise unremarkable. No renal or ureteral calculi. No hydronephrosis. The bladder is unremarkable. Stomach/Bowel: The stomach and small bowel are unremarkable. There is mild wall thickening and stranding surrounding the mid ascending colon. No bowel obstruction. The appendix is not identified. Vascular/Lymphatic: Aortic atherosclerosis. No enlarged  abdominal or pelvic lymph nodes. Reproductive: Status post hysterectomy. The right ovary is unremarkable. There is a 1.5 cm cystic lesion in the left ovary, benign. Other: Trace free fluid in the right paracolic gutter and pelvis. No drainable fluid collection or pneumoperitoneum. Musculoskeletal: No acute or significant osseous findings. Degenerative changes of the lower lumbar spine.  IMPRESSION: 1. Focal wall thickening of the mid ascending colon with surrounding inflammatory changes is suspicious for inflammatory or infectious colitis. In the non-urgent setting following complete resolution of the patient's symptoms, colonoscopy may be warranted to exclude the unlikely possibility of an underlying neoplastic lesion. 2.  Aortic atherosclerosis (ICD10-I70.0). Electronically Signed   By: Titus Dubin M.D.   On: 07/03/2017 11:29    Pending Labs Unresulted Labs (From admission, onward)   Start     Ordered   07/03/17 1355  Gastrointestinal Panel by PCR , Stool  (Gastrointestinal Panel by PCR, Stool)  Once,   R     07/03/17 1354   07/03/17 1354  C difficile quick scan w PCR reflex  (C Difficile quick screen w PCR reflex panel)  Once, for 48 hours,   R    Comments:  Laxatives (last 72 hours)   None     Question Answer Comment  Is your patient experiencing loose or watery stools (3 or more in 24 hours)? Yes   Has the patient received laxatives in the last 24 hours? No   Has a negative Cdiff test resulted in the last 7 days? Yes (testing not recommeded)      07/03/17 1354      Vitals/Pain Today's Vitals   07/03/17 1230 07/03/17 1300 07/03/17 1330 07/03/17 1507  BP: 110/79 (!) 119/58 116/65 113/74  Pulse: 78 86 77 85  Resp: 17 (!) 23 19 (!) 24  Temp:    98.3 F (36.8 C)  TempSrc:    Oral  SpO2: 94% 92% 94% 97%  PainSc:        Isolation Precautions Enteric precautions (UV disinfection)  Medications Medications  iopamidol (ISOVUE-300) 61 % injection (not administered)  metroNIDAZOLE  (FLAGYL) IVPB 500 mg (500 mg Intravenous New Bag/Given 07/03/17 1514)  aspirin tablet 81 mg (not administered)  cholecalciferol (VITAMIN D) tablet 1,000 Units (not administered)  multivitamin with minerals tablet 1 tablet (not administered)  enoxaparin (LOVENOX) injection 30 mg (not administered)  acetaminophen (TYLENOL) tablet 650 mg (not administered)    Or  acetaminophen (TYLENOL) suppository 650 mg (not administered)  0.9 %  sodium chloride infusion (not administered)  metroNIDAZOLE (FLAGYL) IVPB 500 mg (not administered)  lisinopril (PRINIVIL,ZESTRIL) tablet 10 mg (not administered)  cefTRIAXone (ROCEPHIN) 2 g in sodium chloride 0.9 % 100 mL IVPB (not administered)  sodium chloride 0.9 % bolus 1,000 mL (0 mLs Intravenous Stopped 07/03/17 1142)  ondansetron (ZOFRAN) injection 4 mg (4 mg Intravenous Given 07/03/17 1026)  iopamidol (ISOVUE-300) 61 % injection (30 mLs Oral Contrast Given 07/03/17 1109)  iopamidol (ISOVUE-300) 61 % injection 80 mL (80 mLs Intravenous Contrast Given 07/03/17 1108)  ciprofloxacin (CIPRO) IVPB 400 mg (0 mg Intravenous Stopped 07/03/17 1400)    Mobility walks

## 2017-07-03 NOTE — H&P (Signed)
[facility] History and Physical  Wendy Fuhyllis P Maimone ZOX:[id number] DOB: February 05, 1941 DOA: 07/03/2017  Referring physician:  PCP: Laurann MontanaWhite, Cynthia, MD  Specialists:   Chief Complaint: abdominal pains   HPI: Wendy Lewis is a 77 y.o. female with Cerebral Palsy, HTN, CKD, presented with non radiating, mild intermittent lower abdominal pains, associated with nausea for 1 weeks. She reports on episode of vomiting and few episodes of loose stools at home. She reports progressive generalized weakness with poor appetite, unable to drink or eat well. She denies fevers, had some chills. No hematemesis, no hematochezia. No shortness of breath, she reports chronic musculoskeletal chest pains. No exertional symptoms. No sick contacts  -ED: ct abd showed colitis. hospitalis is called for admission   Review of Systems: The patient denies anorexia, fever, weight loss,, vision loss, decreased hearing, hoarseness, chest pain, syncope, dyspnea on exertion, peripheral edema, balance deficits, hemoptysis, abdominal pain, melena, hematochezia, severe indigestion/heartburn, hematuria, incontinence, genital sores, muscle weakness, suspicious skin lesions, transient blindness, difficulty walking, depression, unusual weight change, abnormal bleeding, enlarged lymph nodes, angioedema, and breast masses.    Past Medical History:  Diagnosis Date  . Bursitis   . CP (cerebral palsy) (HCC)   . Hypertension   . Lichen sclerosus 05/2015   vulvar biopsy  . Osteopenia 06/2015   T score -1.6 FRAX 10%/2%   Past Surgical History:  Procedure Laterality Date  . ABDOMINAL HYSTERECTOMY  1975   TAH for cervical dysplasia  . CATARACT EXTRACTION     Bilateral  . TONSILLECTOMY     Social History:  reports that she has quit smoking. she has never used smokeless tobacco. She reports that she does not drink alcohol or use drugs. Home;  where does patient live--home, ALF, SNF? and with whom if at home? Yes;  Can patient  participate in ADLs?  No Known Allergies  Family History  Problem Relation Age of Onset  . Diabetes Mother   . Hypertension Mother   . Heart disease Mother   . Cancer Sister        THYROID  . Cancer Paternal Grandmother        UTERINE OR OVARIAN?  Marland Kitchen. Hypertension Daughter     (be sure to complete)  Prior to Admission medications   Medication Sig Start Date End Date Taking? Authorizing Provider  aspirin 81 MG tablet Take 81 mg by mouth daily.     Yes [provider]  Chlorpheniramine-APAP (CORICIDIN) 2-325 MG TABS Take 1 tablet by mouth as needed (cold).   Yes [provider]  Cholecalciferol (VITAMIN D PO) Take 1 tablet by mouth daily.    Yes [provider]  lisinopril-hydrochlorothiazide (PRINZIDE,ZESTORETIC) 20-25 MG tablet Take 1 tablet by mouth daily.   Yes [provider]  Multiple Vitamin (MULTIVITAMIN WITH MINERALS) TABS tablet Take 1 tablet by mouth daily.   Yes [provider]  ondansetron (ZOFRAN ODT) 4 MG disintegrating tablet 4mg  ODT q4 hours prn nausea/vomit 06/29/17  Yes Bethann BerkshireZammit, Joseph, MD  clobetasol cream (TEMOVATE) 0.05 % Apply 1 application topically 2 (two) times daily. Patient not taking: Reported on 06/29/2017 05/16/17   Fontaine, Nadyne Coombesimothy P, MD  nystatin cream (MYCOSTATIN) Apply 1 application topically 2 (two) times daily. Patient not taking: Reported on 06/29/2017 05/15/15   Dara LordsFontaine, Timothy P, MD   Physical Exam: Vitals:   07/03/17 1022 07/03/17 1148  BP: 99/85 120/63  Pulse: 83 93  Resp: 18 (!) 22  Temp:    SpO2: 96% 97%  General:  Alert, no distress   Eyes: eom-i  ENT: no oral ulcers   Neck: supple   Cardiovascular: s1,s2 rrr  Respiratory: CTA BL  Abdomen: soft, mild lower quadrant tenderness   Skin: no rash   Musculoskeletal: no leg edema   Psychiatric: no hallucinations   Neurologic: CN 2-12 intact   Labs on Admission:  Basic Metabolic Panel: Recent Labs  Lab 06/29/17 2128  07/03/17 0929  NA 133* 135  K 4.2 3.5  CL 97* 95*  CO2 25 28  GLUCOSE 149* 119*  BUN 20 14  CREATININE 1.33* 1.38*  CALCIUM 9.0 9.2   Liver Function Tests: Recent Labs  Lab 06/29/17 2128 07/03/17 0929  AST 37 70*  ALT 20 47  ALKPHOS 78 88  BILITOT 1.0 0.7  PROT 7.6 7.1  ALBUMIN 4.0 3.7   Recent Labs  Lab 07/03/17 0929  LIPASE 24   No results for input(s): AMMONIA in the last 168 hours. CBC: Recent Labs  Lab 06/29/17 2128 07/03/17 0929  WBC 9.0 9.2  NEUTROABS 8.3* 7.4  HGB 11.6* 11.7*  HCT 33.8* 33.3*  MCV 91.1 88.6  PLT 215 229   Cardiac Enzymes: No results for input(s): CKTOTAL, CKMB, CKMBINDEX, TROPONINI in the last 168 hours.  BNP (last 3 results) No results for input(s): BNP in the last 8760 hours.  ProBNP (last 3 results) No results for input(s): PROBNP in the last 8760 hours.  CBG: No results for input(s): GLUCAP in the last 168 hours.  Radiological Exams on Admission: Ct Abdomen Pelvis W Contrast  Result Date: 07/03/2017 CLINICAL DATA:  Persistent nausea and vomiting. EXAM: CT ABDOMEN AND PELVIS WITH CONTRAST TECHNIQUE: Multidetector CT imaging of the abdomen and pelvis was performed using the standard protocol following bolus administration of intravenous contrast. CONTRAST:  80mL ISOVUE-300 IOPAMIDOL (ISOVUE-300) INJECTION 61%, 30mL ISOVUE-300 IOPAMIDOL (ISOVUE-300) INJECTION 61% COMPARISON:  None. FINDINGS: Lower chest: No acute abnormality. Hepatobiliary: Subcentimeter low-density lesion in the left hepatic lobe is too small to characterize. No other focal liver abnormality. Gallbladder is unremarkable. No biliary dilatation. Pancreas: Unremarkable. No pancreatic ductal dilatation or surrounding inflammatory changes. Spleen: Normal in size without focal abnormality. Adrenals/Urinary Tract: The adrenal glands are unremarkable. 1.1 cm cyst in the midpole of the left kidney. The kidneys are otherwise unremarkable. No renal or ureteral calculi. No  hydronephrosis. The bladder is unremarkable. Stomach/Bowel: The stomach and small bowel are unremarkable. There is mild wall thickening and stranding surrounding the mid ascending colon. No bowel obstruction. The appendix is not identified. Vascular/Lymphatic: Aortic atherosclerosis. No enlarged abdominal or pelvic lymph nodes. Reproductive: Status post hysterectomy. The right ovary is unremarkable. There is a 1.5 cm cystic lesion in the left ovary, benign. Other: Trace free fluid in the right paracolic gutter and pelvis. No drainable fluid collection or pneumoperitoneum. Musculoskeletal: No acute or significant osseous findings. Degenerative changes of the lower lumbar spine. IMPRESSION: 1. Focal wall thickening of the mid ascending colon with surrounding inflammatory changes is suspicious for inflammatory or infectious colitis. In the non-urgent setting following complete resolution of the patient's symptoms, colonoscopy may be warranted to exclude the unlikely possibility of an underlying neoplastic lesion. 2.  Aortic atherosclerosis (ICD10-I70.0). Electronically Signed   By: Obie Dredge M.D.   On: 07/03/2017 11:29    EKG: Independently reviewed.   Assessment/Plan Active Problems:   CP (cerebral palsy) (HCC)   Hypertension   Colitis   77 y.o. female with Cerebral Palsy, HTN, CKD, presented with non radiating, mild intermittent  lower abdominal pains, associated with nausea for 1 weeks.  Acute colitis. Likely infectious colitis. No bleeding. CT abd: Focal wall thickening of the mid ascending colon with surrounding inflammatory changes is suspicious for inflammatory or infectious colitis.  -will treat with empiric antibiotics for possible infectious colitis. Iv fluids, antiemetics, diet clears and advance as tolerated. Check stool path pcr, c diff. Recommended to f/u with outpatient colonoscopy   HTN. BP is soft. Will hold diuretic while on iv fluids. Cont lisinopril. monitor   None;  if  consultant consulted, please document name and whether formally or informally consulted  Code Status: full (must indicate code status--if unknown or must be presumed, indicate so) Family Communication: d/w patient, her family. ED (indicate person spoken with, if applicable, with phone number if by telephone) Disposition Plan: home 24-48 hrs  (indicate anticipated LOS)  Time spent:>45 minutes   Esperanza Sheets [facility] Pager [phone]  If 7PM-7AM, please contact night-coverage [email or web address] Password [email or web address] 07/03/2017, 1:42 PM

## 2017-07-04 DIAGNOSIS — G809 Cerebral palsy, unspecified: Secondary | ICD-10-CM

## 2017-07-04 DIAGNOSIS — Z87410 Personal history of cervical dysplasia: Secondary | ICD-10-CM | POA: Diagnosis not present

## 2017-07-04 DIAGNOSIS — L900 Lichen sclerosus et atrophicus: Secondary | ICD-10-CM | POA: Diagnosis present

## 2017-07-04 DIAGNOSIS — I10 Essential (primary) hypertension: Secondary | ICD-10-CM | POA: Diagnosis not present

## 2017-07-04 DIAGNOSIS — Z87891 Personal history of nicotine dependence: Secondary | ICD-10-CM | POA: Diagnosis not present

## 2017-07-04 DIAGNOSIS — I471 Supraventricular tachycardia: Secondary | ICD-10-CM | POA: Diagnosis present

## 2017-07-04 DIAGNOSIS — Z90710 Acquired absence of both cervix and uterus: Secondary | ICD-10-CM | POA: Diagnosis not present

## 2017-07-04 DIAGNOSIS — Z8249 Family history of ischemic heart disease and other diseases of the circulatory system: Secondary | ICD-10-CM | POA: Diagnosis not present

## 2017-07-04 DIAGNOSIS — E876 Hypokalemia: Secondary | ICD-10-CM | POA: Diagnosis present

## 2017-07-04 DIAGNOSIS — R1030 Lower abdominal pain, unspecified: Secondary | ICD-10-CM | POA: Diagnosis present

## 2017-07-04 DIAGNOSIS — Z79899 Other long term (current) drug therapy: Secondary | ICD-10-CM | POA: Diagnosis not present

## 2017-07-04 DIAGNOSIS — K55039 Acute (reversible) ischemia of large intestine, extent unspecified: Secondary | ICD-10-CM | POA: Diagnosis present

## 2017-07-04 DIAGNOSIS — Z9842 Cataract extraction status, left eye: Secondary | ICD-10-CM | POA: Diagnosis not present

## 2017-07-04 DIAGNOSIS — R05 Cough: Secondary | ICD-10-CM | POA: Diagnosis present

## 2017-07-04 DIAGNOSIS — K529 Noninfective gastroenteritis and colitis, unspecified: Secondary | ICD-10-CM | POA: Diagnosis not present

## 2017-07-04 DIAGNOSIS — N179 Acute kidney failure, unspecified: Secondary | ICD-10-CM | POA: Diagnosis present

## 2017-07-04 DIAGNOSIS — Z9841 Cataract extraction status, right eye: Secondary | ICD-10-CM | POA: Diagnosis not present

## 2017-07-04 DIAGNOSIS — Z7982 Long term (current) use of aspirin: Secondary | ICD-10-CM | POA: Diagnosis not present

## 2017-07-04 DIAGNOSIS — M8580 Other specified disorders of bone density and structure, unspecified site: Secondary | ICD-10-CM | POA: Diagnosis present

## 2017-07-04 DIAGNOSIS — D649 Anemia, unspecified: Secondary | ICD-10-CM | POA: Diagnosis not present

## 2017-07-04 LAB — GASTROINTESTINAL PANEL BY PCR, STOOL (REPLACES STOOL CULTURE)
ADENOVIRUS F40/41: NOT DETECTED
ASTROVIRUS: NOT DETECTED
Campylobacter species: NOT DETECTED
Cryptosporidium: NOT DETECTED
Cyclospora cayetanensis: NOT DETECTED
ENTAMOEBA HISTOLYTICA: NOT DETECTED
ENTEROAGGREGATIVE E COLI (EAEC): NOT DETECTED
ENTEROPATHOGENIC E COLI (EPEC): NOT DETECTED
ENTEROTOXIGENIC E COLI (ETEC): NOT DETECTED
Giardia lamblia: NOT DETECTED
NOROVIRUS GI/GII: NOT DETECTED
Plesimonas shigelloides: NOT DETECTED
Rotavirus A: NOT DETECTED
SAPOVIRUS (I, II, IV, AND V): NOT DETECTED
SHIGA LIKE TOXIN PRODUCING E COLI (STEC): NOT DETECTED
Salmonella species: NOT DETECTED
Shigella/Enteroinvasive E coli (EIEC): NOT DETECTED
VIBRIO CHOLERAE: NOT DETECTED
VIBRIO SPECIES: NOT DETECTED
Yersinia enterocolitica: NOT DETECTED

## 2017-07-04 MED ORDER — POTASSIUM CHLORIDE IN NACL 20-0.9 MEQ/L-% IV SOLN
INTRAVENOUS | Status: DC
  Administered 2017-07-04 – 2017-07-06 (×5): via INTRAVENOUS
  Filled 2017-07-04 (×6): qty 1000

## 2017-07-04 NOTE — Plan of Care (Signed)
Patient daughter contacted night nurse and asked for update on patient.  Night nurse attempted to answer questions, but daughter got very angy and began to curse at the nurse - saying she shouldn't having trouble understanding her mom's nurse (meaning her accent).    I (the day nurse) was still on the floor charting - so she asked me to assist.  I attempted to casually explain the situation and give the daughter an update that her mom (overall) is doing much better - currently treating with antibiotics and waiting for the lab result to reveal exactly what/if any infection we may need to treat (ie- GI panel).  This information seemed to add great anxiety to the daughter and she insisted to talk with a Production designer, theatre/television/filmmanager. I told her I would gladly have a manager contact her and [facility] (While she was on the phone with me). [facility]  stated [facility] were currently in the ED - but would be happy to contact her when they were able and took her number down.  The daughter got upset that she could not talk with someone immediately and stated she would have to "go up the ladder" to get some help.

## 2017-07-04 NOTE — Progress Notes (Signed)
PROGRESS NOTE    Wendy Lewis  ZOX:[id number] DOB: 1941/02/20 DOA: 07/03/2017 PCP: [name], MD   Outpatient Specialists:     Brief Narrative:  Wendy Lewis is a 77 y.o. female with Cerebral Palsy, HTN, CKD, presented with non radiating, mild intermittent lower abdominal pains, associated with nausea for 1 weeks. She reports on episode of vomiting and few episodes of loose stools at home. She reports progressive generalized weakness with poor appetite, unable to drink or eat well. She denies fevers, had some chills. No hematemesis, no hematochezia. No shortness of breath, she reports chronic musculoskeletal chest pains. No exertional symptoms. No sick contacts  -ED: ct abd showed colitis. hospitalis is called for admission      Assessment & Plan:   Active Problems:   CP (cerebral palsy) (HCC)   Hypertension   Colitis   Acute colitis. Likely infectious colitis  -CT abd: Focal wall thickening of the mid ascending colon with surrounding inflammatory changes is suspicious for inflammatory or infectious colitis.  -will treat with empiric antibiotics for possible infectious colitis -Iv fluids, antiemetics, diet clears and advance as tolerated -c diff negative -GI pathogen panel pending -Recommended to f/u with outpatient colonoscopy   HTN -monitor on ACE  cerebral palsy -PT eval -patient lives at home with her blind dog  Cough -no sign of PNA   DVT prophylaxis:  SCD's  Code Status: Full Code   Family Communication:   Disposition Plan:     Consultants:    Subjective: Not feeling well "all over"  Objective: Vitals:   07/03/17 1629 07/03/17 2144 07/04/17 0634 07/04/17 0929  BP: 118/68 112/66 (!) 108/59 112/62  Pulse: 83 80 80   Resp: 20 20 20    Temp: 99.3 F (37.4 C) 100.2 F (37.9 C) 97.9 F (36.6 C)   TempSrc: Oral Oral Oral   SpO2: 96% 96% 96%   Weight: 45.5 kg (100 lb 5 oz)     Height: 4\' 11"  (1.499 m)       Intake/Output  Summary (Last 24 hours) at 07/04/2017 1254 Last data filed at 07/04/2017 0500 Gross per 24 hour  Intake 2091.67 ml  Output -  Net 2091.67 ml   Filed Weights   07/03/17 1629  Weight: 45.5 kg (100 lb 5 oz)    Examination:  General exam: Appears calm and comfortable  Respiratory system: Clear to auscultation. Respiratory effort normal. Cardiovascular system: S1 & S2 heard, RRR. No JVD, murmurs, rubs, gallops or clicks. No pedal edema. Gastrointestinal system: Abdomen is nondistended, soft and nontender. No organomegaly or masses felt. Normal bowel sounds heard. Central nervous system: Alert and oriented. No focal neurological deficits. Extremities: Symmetric 5 x 5 power. Skin: No rashes, lesions or ulcers Psychiatry: Judgement and insight appear normal. Mood & affect appropriate.     Data Reviewed: I have personally reviewed following labs and imaging studies  CBC: Recent Labs  Lab 06/29/17 2128 07/03/17 0929  WBC 9.0 9.2  NEUTROABS 8.3* 7.4  HGB 11.6* 11.7*  HCT 33.8* 33.3*  MCV 91.1 88.6  PLT 215 229   Basic Metabolic Panel: Recent Labs  Lab 06/29/17 2128 07/03/17 0929  NA 133* 135  K 4.2 3.5  CL 97* 95*  CO2 25 28  GLUCOSE 149* 119*  BUN 20 14  CREATININE 1.33* 1.38*  CALCIUM 9.0 9.2   GFR: Estimated Creatinine Clearance: 24 mL/min (A) (by C-G formula based on SCr of 1.38 mg/dL (H)). Liver Function Tests: Recent Labs  Lab 06/29/17  2128 07/03/17 0929  AST 37 70*  ALT 20 47  ALKPHOS 78 88  BILITOT 1.0 0.7  PROT 7.6 7.1  ALBUMIN 4.0 3.7   Recent Labs  Lab 07/03/17 0929  LIPASE 24   No results for input(s): AMMONIA in the last 168 hours. Coagulation Profile: No results for input(s): INR, PROTIME in the last 168 hours. Cardiac Enzymes: No results for input(s): CKTOTAL, CKMB, CKMBINDEX, TROPONINI in the last 168 hours. BNP (last 3 results) No results for input(s): PROBNP in the last 8760 hours. HbA1C: No results for input(s): HGBA1C in the last  72 hours. CBG: No results for input(s): GLUCAP in the last 168 hours. Lipid Profile: No results for input(s): CHOL, HDL, LDLCALC, TRIG, CHOLHDL, LDLDIRECT in the last 72 hours. Thyroid Function Tests: No results for input(s): TSH, T4TOTAL, FREET4, T3FREE, THYROIDAB in the last 72 hours. Anemia Panel: No results for input(s): VITAMINB12, FOLATE, FERRITIN, TIBC, IRON, RETICCTPCT in the last 72 hours. Urine analysis:    Component Value Date/Time   COLORURINE YELLOW 07/03/2017 1145   APPEARANCEUR CLEAR 07/03/2017 1145   LABSPEC 1.020 07/03/2017 1145   PHURINE 5.0 07/03/2017 1145   GLUCOSEU NEGATIVE 07/03/2017 1145   HGBUR NEGATIVE 07/03/2017 1145   BILIRUBINUR NEGATIVE 07/03/2017 1145   KETONESUR 5 (A) 07/03/2017 1145   PROTEINUR NEGATIVE 07/03/2017 1145   NITRITE NEGATIVE 07/03/2017 1145   LEUKOCYTESUR MODERATE (A) 07/03/2017 1145       Recent Results (from the past 240 hour(s))  C difficile quick scan w PCR reflex     Status: None   Collection Time: 07/03/17  1:54 PM  Result Value Ref Range Status   C Diff antigen NEGATIVE NEGATIVE Final   C Diff toxin NEGATIVE NEGATIVE Final   C Diff interpretation No C. difficile detected.  Final    Comment: Performed at [facility], [address], [place], [place] [address]      Anti-infectives (From admission, onward)   Start     Dose/Rate Route Frequency Ordered Stop   07/04/17 1000  cefTRIAXone (ROCEPHIN) 2 g in sodium chloride 0.9 % 100 mL IVPB     2 g 200 mL/hr over 30 Minutes Intravenous Every 24 hours 07/03/17 1400     07/03/17 2200  metroNIDAZOLE (FLAGYL) IVPB 500 mg     500 mg 100 mL/hr over 60 Minutes Intravenous Every 8 hours 07/03/17 1354     07/03/17 1200  ciprofloxacin (CIPRO) IVPB 400 mg     400 mg 200 mL/hr over 60 Minutes Intravenous  Once 07/03/17 1152 07/03/17 1400   07/03/17 1200  metroNIDAZOLE (FLAGYL) IVPB 500 mg     500 mg 100 mL/hr over 60 Minutes Intravenous  Once 07/03/17 1152  07/03/17 1641       Radiology Studies: Ct Abdomen Pelvis W Contrast  Result Date: 07/03/2017 CLINICAL DATA:  Persistent nausea and vomiting. EXAM: CT ABDOMEN AND PELVIS WITH CONTRAST TECHNIQUE: Multidetector CT imaging of the abdomen and pelvis was performed using the standard protocol following bolus administration of intravenous contrast. CONTRAST:  80mL ISOVUE-300 IOPAMIDOL (ISOVUE-300) INJECTION 61%, 30mL ISOVUE-300 IOPAMIDOL (ISOVUE-300) INJECTION 61% COMPARISON:  None. FINDINGS: Lower chest: No acute abnormality. Hepatobiliary: Subcentimeter low-density lesion in the left hepatic lobe is too small to characterize. No other focal liver abnormality. Gallbladder is unremarkable. No biliary dilatation. Pancreas: Unremarkable. No pancreatic ductal dilatation or surrounding inflammatory changes. Spleen: Normal in size without focal abnormality. Adrenals/Urinary Tract: The adrenal glands are unremarkable. 1.1 cm cyst in the midpole  of the left kidney. The kidneys are otherwise unremarkable. No renal or ureteral calculi. No hydronephrosis. The bladder is unremarkable. Stomach/Bowel: The stomach and small bowel are unremarkable. There is mild wall thickening and stranding surrounding the mid ascending colon. No bowel obstruction. The appendix is not identified. Vascular/Lymphatic: Aortic atherosclerosis. No enlarged abdominal or pelvic lymph nodes. Reproductive: Status post hysterectomy. The right ovary is unremarkable. There is a 1.5 cm cystic lesion in the left ovary, benign. Other: Trace free fluid in the right paracolic gutter and pelvis. No drainable fluid collection or pneumoperitoneum. Musculoskeletal: No acute or significant osseous findings. Degenerative changes of the lower lumbar spine. IMPRESSION: 1. Focal wall thickening of the mid ascending colon with surrounding inflammatory changes is suspicious for inflammatory or infectious colitis. In the non-urgent setting following complete resolution of  the patient's symptoms, colonoscopy may be warranted to exclude the unlikely possibility of an underlying neoplastic lesion. 2.  Aortic atherosclerosis (ICD10-I70.0). Electronically Signed   By: Obie DredgeWilliam T Derry M.D.   On: 07/03/2017 11:29        Scheduled Meds: . aspirin EC  81 mg Oral Daily  . cholecalciferol  1,000 Units Oral Daily  . enoxaparin (LOVENOX) injection  30 mg Subcutaneous Q24H  . feeding supplement  1 Container Oral TID BM  . lisinopril  10 mg Oral Daily  . multivitamin with minerals  1 tablet Oral Daily   Continuous Infusions: . 0.9 % NaCl with KCl 20 mEq / L 75 mL/hr at 07/04/17 1129  . cefTRIAXone (ROCEPHIN)  IV Stopped (07/04/17 0959)  . metronidazole 500 mg (07/04/17 0530)     LOS: 0 days    Time spent: 35 min    Joseph ArtJessica U Jameyah Fennewald, DO [facility] Pager [phone]  If 7PM-7AM, please contact night-coverage [email or web address] Password [email or web address] 07/04/2017, 12:54 PM

## 2017-07-05 LAB — CBC
HEMATOCRIT: 26.2 % — AB (ref 36.0–46.0)
HEMATOCRIT: 26.1 % — AB (ref 36.0–46.0)
HEMOGLOBIN: 9.2 g/dL — AB (ref 12.0–15.0)
HEMOGLOBIN: 9.1 g/dL — AB (ref 12.0–15.0)
MCH: 31.3 pg (ref 26.0–34.0)
MCH: 31.3 pg (ref 26.0–34.0)
MCHC: 35.1 g/dL (ref 30.0–36.0)
MCHC: 34.9 g/dL (ref 30.0–36.0)
MCV: 89.1 fL (ref 78.0–100.0)
MCV: 89.7 fL (ref 78.0–100.0)
Platelets: 208 10*3/uL (ref 150–400)
Platelets: 241 10*3/uL (ref 150–400)
RBC: 2.94 MIL/uL — ABNORMAL LOW (ref 3.87–5.11)
RBC: 2.91 MIL/uL — ABNORMAL LOW (ref 3.87–5.11)
RDW: 13 % (ref 11.5–15.5)
RDW: 13 % (ref 11.5–15.5)
WBC: 8 10*3/uL (ref 4.0–10.5)
WBC: 9.1 10*3/uL (ref 4.0–10.5)

## 2017-07-05 LAB — BASIC METABOLIC PANEL
Anion gap: 9 (ref 5–15)
BUN: 8 mg/dL (ref 6–20)
CALCIUM: 7.9 mg/dL — AB (ref 8.9–10.3)
CHLORIDE: 104 mmol/L (ref 101–111)
CO2: 24 mmol/L (ref 22–32)
CREATININE: 0.99 mg/dL (ref 0.44–1.00)
GFR calc Af Amer: >60 mL/min (ref >60)
GFR calc non Af Amer: 54 mL/min — ABNORMAL LOW (ref >60)
GLUCOSE: 108 mg/dL — AB (ref 65–99)
Potassium: 3.3 mmol/L — ABNORMAL LOW (ref 3.5–5.1)
Sodium: 137 mmol/L (ref 135–145)

## 2017-07-05 LAB — OCCULT BLOOD X 1 CARD TO LAB, STOOL: FECAL OCCULT BLD: POSITIVE — AB

## 2017-07-05 LAB — MAGNESIUM: Magnesium: 1.6 mg/dL — ABNORMAL LOW (ref 1.7–2.4)

## 2017-07-05 MED ORDER — POTASSIUM CHLORIDE CRYS ER 10 MEQ PO TBCR
30.0000 meq | EXTENDED_RELEASE_TABLET | Freq: Once | ORAL | Status: AC
  Administered 2017-07-05: 30 meq via ORAL
  Filled 2017-07-05: qty 3

## 2017-07-05 MED ORDER — PANTOPRAZOLE SODIUM 40 MG IV SOLR
40.0000 mg | Freq: Two times a day (BID) | INTRAVENOUS | Status: DC
  Administered 2017-07-05 – 2017-07-07 (×5): 40 mg via INTRAVENOUS
  Filled 2017-07-05 (×5): qty 40

## 2017-07-05 NOTE — Progress Notes (Addendum)
PROGRESS NOTE    Wendy Lewis  ZOX:[id number] DOB: [birth year] DOA: 07/03/2017 PCP: [name], MD   Outpatient Specialists:     Brief Narrative:  Wendy Lewis is a 77 y.o. female with Cerebral Palsy, HTN presented with non radiating, mild intermittent lower abdominal pains, associated with nausea for 1 weeks. She reports on episode of vomiting and few episodes of loose stools at home. She reports progressive generalized weakness with poor appetite, unable to drink or eat well. She denies fevers, had some chills. No hematemesis, no hematochezia. No shortness of breath, she reports chronic musculoskeletal chest pains. No exertional symptoms. No sick contacts  -ED: ct abd showed colitis. hospitalis is called for admission.  On the floor, patient had dark stools with a drop in Hgb worrisome for GI bleed     Assessment & Plan:   Active Problems:   CP (cerebral palsy) (HCC)   Hypertension   Colitis   Acute colitis. Likely infectious colitis with ? GI bleed (dark stools and drop in Hgb) -CT abd: Focal wall thickening of the mid ascending colon with surrounding inflammatory changes is suspicious for inflammatory or infectious colitis.  -will treat with empiric antibiotics for possible infectious colitis -GI consult -c diff negative -GI pathogen panel negative -last colonoscopy was 10 years ago with [facility] patient thinks  HTN -hold ACE for low BP  AKI -improved with IVF and holding ACE  cerebral palsy -PT eval -patient lives at home with her blind dog  Cough -no sign of PNA on imaging  Hypokalemia -replete  DVT prophylaxis:  SCD's  Code Status: Full Code   Family Communication: Offered to call family-- patient declined  Disposition Plan:     Consultants:  GI  Subjective: Still not feeling well, had dark stool this AM  Objective: Vitals:   07/04/17 2052 07/05/17 0500 07/05/17 0526 07/05/17 0821  BP: 123/75  90/64 102/78  Pulse: 88  78 86   Resp: 18  16   Temp: 99.3 F (37.4 C)  99 F (37.2 C)   TempSrc: Oral Oral    SpO2: 97%  96% 96%  Weight:      Height:        Intake/Output Summary (Last 24 hours) at 07/05/2017 0855 Last data filed at 07/04/2017 2330 Gross per 24 hour  Intake 1550 ml  Output -  Net 1550 ml   Filed Weights   07/03/17 1629  Weight: 45.5 kg (100 lb 5 oz)    Examination:  General exam: anxious appearing Respiratory system: no increased work of breathing nor wheezing Cardiovascular system: rrr Gastrointestinal system: +Bs, tender in lower quadrants Central nervous system: Alert- has tremors at baseline Extremities: moves all 4 ext Psychiatry: Judgement and insight appear normal. Mood & affect appropriate.     Data Reviewed: I have personally reviewed following labs and imaging studies  CBC: Recent Labs  Lab 06/29/17 2128 07/03/17 0929 07/05/17 0443  WBC 9.0 9.2 8.0  NEUTROABS 8.3* 7.4  --   HGB 11.6* 11.7* 9.2*  HCT 33.8* 33.3* 26.2*  MCV 91.1 88.6 89.1  PLT 215 229 208   Basic Metabolic Panel: Recent Labs  Lab 06/29/17 2128 07/03/17 0929 07/05/17 0443  NA 133* 135 137  K 4.2 3.5 3.3*  CL 97* 95* 104  CO2 25 28 24   GLUCOSE 149* 119* 108*  BUN 20 14 8   CREATININE 1.33* 1.38* 0.99  CALCIUM 9.0 9.2 7.9*  MG  --   --  1.6*  GFR: Estimated Creatinine Clearance: 33.5 mL/min (by C-G formula based on SCr of 0.99 mg/dL). Liver Function Tests: Recent Labs  Lab 06/29/17 2128 07/03/17 0929  AST 37 70*  ALT 20 47  ALKPHOS 78 88  BILITOT 1.0 0.7  PROT 7.6 7.1  ALBUMIN 4.0 3.7   Recent Labs  Lab 07/03/17 0929  LIPASE 24   No results for input(s): AMMONIA in the last 168 hours. Coagulation Profile: No results for input(s): INR, PROTIME in the last 168 hours. Cardiac Enzymes: No results for input(s): CKTOTAL, CKMB, CKMBINDEX, TROPONINI in the last 168 hours. BNP (last 3 results) No results for input(s): PROBNP in the last 8760 hours. HbA1C: No results for  input(s): HGBA1C in the last 72 hours. CBG: No results for input(s): GLUCAP in the last 168 hours. Lipid Profile: No results for input(s): CHOL, HDL, LDLCALC, TRIG, CHOLHDL, LDLDIRECT in the last 72 hours. Thyroid Function Tests: No results for input(s): TSH, T4TOTAL, FREET4, T3FREE, THYROIDAB in the last 72 hours. Anemia Panel: No results for input(s): VITAMINB12, FOLATE, FERRITIN, TIBC, IRON, RETICCTPCT in the last 72 hours. Urine analysis:    Component Value Date/Time   COLORURINE YELLOW 07/03/2017 1145   APPEARANCEUR CLEAR 07/03/2017 1145   LABSPEC 1.020 07/03/2017 1145   PHURINE 5.0 07/03/2017 1145   GLUCOSEU NEGATIVE 07/03/2017 1145   HGBUR NEGATIVE 07/03/2017 1145   BILIRUBINUR NEGATIVE 07/03/2017 1145   KETONESUR 5 (A) 07/03/2017 1145   PROTEINUR NEGATIVE 07/03/2017 1145   NITRITE NEGATIVE 07/03/2017 1145   LEUKOCYTESUR MODERATE (A) 07/03/2017 1145       Recent Results (from the past 240 hour(s))  C difficile quick scan w PCR reflex     Status: None   Collection Time: 07/03/17  1:54 PM  Result Value Ref Range Status   C Diff antigen NEGATIVE NEGATIVE Final   C Diff toxin NEGATIVE NEGATIVE Final   C Diff interpretation No C. difficile detected.  Final    Comment: Performed at [facility], [address], [place], [place] [address]  Gastrointestinal Panel by PCR , Stool     Status: None   Collection Time: 07/03/17  1:55 PM  Result Value Ref Range Status   Campylobacter species NOT DETECTED NOT DETECTED Final   Plesimonas shigelloides NOT DETECTED NOT DETECTED Final   Salmonella species NOT DETECTED NOT DETECTED Final   Yersinia enterocolitica NOT DETECTED NOT DETECTED Final   Vibrio species NOT DETECTED NOT DETECTED Final   Vibrio cholerae NOT DETECTED NOT DETECTED Final   Enteroaggregative E coli (EAEC) NOT DETECTED NOT DETECTED Final   Enteropathogenic E coli (EPEC) NOT DETECTED NOT DETECTED Final   Enterotoxigenic E coli (ETEC) NOT DETECTED  NOT DETECTED Final   Shiga like toxin producing E coli (STEC) NOT DETECTED NOT DETECTED Final   Shigella/Enteroinvasive E coli (EIEC) NOT DETECTED NOT DETECTED Final   Cryptosporidium NOT DETECTED NOT DETECTED Final   Cyclospora cayetanensis NOT DETECTED NOT DETECTED Final   Entamoeba histolytica NOT DETECTED NOT DETECTED Final   Giardia lamblia NOT DETECTED NOT DETECTED Final   Adenovirus F40/41 NOT DETECTED NOT DETECTED Final   Astrovirus NOT DETECTED NOT DETECTED Final   Norovirus GI/GII NOT DETECTED NOT DETECTED Final   Rotavirus A NOT DETECTED NOT DETECTED Final   Sapovirus (I, II, IV, and V) NOT DETECTED NOT DETECTED Final    Comment: Performed at [facility], [address] [place], [place] [address]      Anti-infectives (From admission, onward)  Start     Dose/Rate Route Frequency Ordered Stop   07/04/17 1000  cefTRIAXone (ROCEPHIN) 2 g in sodium chloride 0.9 % 100 mL IVPB     2 g 200 mL/hr over 30 Minutes Intravenous Every 24 hours 07/03/17 1400     07/03/17 2200  metroNIDAZOLE (FLAGYL) IVPB 500 mg     500 mg 100 mL/hr over 60 Minutes Intravenous Every 8 hours 07/03/17 1354     07/03/17 1200  ciprofloxacin (CIPRO) IVPB 400 mg     400 mg 200 mL/hr over 60 Minutes Intravenous  Once 07/03/17 1152 07/03/17 1400   07/03/17 1200  metroNIDAZOLE (FLAGYL) IVPB 500 mg     500 mg 100 mL/hr over 60 Minutes Intravenous  Once 07/03/17 1152 07/03/17 1641       Radiology Studies: Ct Abdomen Pelvis W Contrast  Result Date: 07/03/2017 CLINICAL DATA:  Persistent nausea and vomiting. EXAM: CT ABDOMEN AND PELVIS WITH CONTRAST TECHNIQUE: Multidetector CT imaging of the abdomen and pelvis was performed using the standard protocol following bolus administration of intravenous contrast. CONTRAST:  80mL ISOVUE-300 IOPAMIDOL (ISOVUE-300) INJECTION 61%, 30mL ISOVUE-300 IOPAMIDOL (ISOVUE-300) INJECTION 61% COMPARISON:  None. FINDINGS: Lower chest: No acute abnormality. Hepatobiliary:  Subcentimeter low-density lesion in the left hepatic lobe is too small to characterize. No other focal liver abnormality. Gallbladder is unremarkable. No biliary dilatation. Pancreas: Unremarkable. No pancreatic ductal dilatation or surrounding inflammatory changes. Spleen: Normal in size without focal abnormality. Adrenals/Urinary Tract: The adrenal glands are unremarkable. 1.1 cm cyst in the midpole of the left kidney. The kidneys are otherwise unremarkable. No renal or ureteral calculi. No hydronephrosis. The bladder is unremarkable. Stomach/Bowel: The stomach and small bowel are unremarkable. There is mild wall thickening and stranding surrounding the mid ascending colon. No bowel obstruction. The appendix is not identified. Vascular/Lymphatic: Aortic atherosclerosis. No enlarged abdominal or pelvic lymph nodes. Reproductive: Status post hysterectomy. The right ovary is unremarkable. There is a 1.5 cm cystic lesion in the left ovary, benign. Other: Trace free fluid in the right paracolic gutter and pelvis. No drainable fluid collection or pneumoperitoneum. Musculoskeletal: No acute or significant osseous findings. Degenerative changes of the lower lumbar spine. IMPRESSION: 1. Focal wall thickening of the mid ascending colon with surrounding inflammatory changes is suspicious for inflammatory or infectious colitis. In the non-urgent setting following complete resolution of the patient's symptoms, colonoscopy may be warranted to exclude the unlikely possibility of an underlying neoplastic lesion. 2.  Aortic atherosclerosis (ICD10-I70.0). Electronically Signed   By: Obie DredgeWilliam T Derry M.D.   On: 07/03/2017 11:29        Scheduled Meds: . aspirin EC  81 mg Oral Daily  . cholecalciferol  1,000 Units Oral Daily  . feeding supplement  1 Container Oral TID BM  . multivitamin with minerals  1 tablet Oral Daily  . potassium chloride  30 mEq Oral Once   Continuous Infusions: . 0.9 % NaCl with KCl 20 mEq / L 75  mL/hr at 07/04/17 2203  . cefTRIAXone (ROCEPHIN)  IV Stopped (07/04/17 0959)  . metronidazole Stopped (07/05/17 0724)     LOS: 1 day    Time spent: 35 min    Joseph ArtJessica U Jamarion [place], DO [facility] Pager [phone]  If 7PM-7AM, please contact night-coverage [email or web address] Password [email or web address] 07/05/2017, 8:55 AM

## 2017-07-05 NOTE — Consult Note (Signed)
[facility] Consult  Referring Provider: Joseph ArtVann, Jessica U, DO Primary Care Physician:  Laurann MontanaWhite, Cynthia, MD Primary Gastroenterologist: Dr.Edwards([facility])  Reason for Consultation:  Abnormal CAT scan showing right-sided colitis, drop in hemoglobin  HPI: Wendy Lewis is a 77 y.o. female was admitted on 07/03/2017 with complains of lower abdominal pain associated with nausea for a week. She had a few episodes of vomiting and diarrhea at home associated with decreased appetite which prompted her to come to the ED where she had a CAT scan with contrast which showed focal wall thickening of mid ascending colon with surrounding inflammatory changes suspicious for inflammatory or infectious colitis. Patient reports multiple, loose, watery and for the last 1 day bloody bowel movement along with nocturnal diarrhea. She reports improvement in nausea and once her diet to be advanced and also reports that the abdominal pain has resolved. Her hemoglobin on admission was 11.6 and is 9.2 today. Patient had a colonoscopy in 2009 for screening which was reported as normal, repeat colonoscopy advised in 10 years if age-appropriate. Patient otherwise denies acid reflux, regurgitation, difficulty swallowing or pain on swallowing. She denies recent unintentional weight loss, loss of appetite, early satiety or bloating.  Past Medical History:  Diagnosis Date  . Bursitis   . CP (cerebral palsy) (HCC)   . Hypertension   . Lichen sclerosus 05/2015   vulvar biopsy  . Osteopenia 06/2015   T score -1.6 FRAX 10%/2%    Past Surgical History:  Procedure Laterality Date  . ABDOMINAL HYSTERECTOMY  1975   TAH for cervical dysplasia  . CATARACT EXTRACTION     Bilateral  . TONSILLECTOMY      Prior to Admission medications   Medication Sig Start Date End Date Taking? Authorizing Provider  aspirin 81 MG tablet Take 81 mg by mouth daily.     Yes [provider]  Chlorpheniramine-APAP (CORICIDIN)  2-325 MG TABS Take 1 tablet by mouth as needed (cold).   Yes [provider]  Cholecalciferol (VITAMIN D PO) Take 1 tablet by mouth daily.    Yes [provider]  lisinopril-hydrochlorothiazide (PRINZIDE,ZESTORETIC) 20-25 MG tablet Take 1 tablet by mouth daily.   Yes [provider]  Multiple Vitamin (MULTIVITAMIN WITH MINERALS) TABS tablet Take 1 tablet by mouth daily.   Yes [provider]  ondansetron (ZOFRAN ODT) 4 MG disintegrating tablet 4mg  ODT q4 hours prn nausea/vomit 06/29/17  Yes Bethann BerkshireZammit, Joseph, MD  clobetasol cream (TEMOVATE) 0.05 % Apply 1 application topically 2 (two) times daily. Patient not taking: Reported on 06/29/2017 05/16/17   Fontaine, Nadyne Coombesimothy P, MD  nystatin cream (MYCOSTATIN) Apply 1 application topically 2 (two) times daily. Patient not taking: Reported on 06/29/2017 05/15/15   Fontaine, Nadyne Coombesimothy P, MD    Current Facility-Administered Medications  Medication Dose Route Frequency Provider Last Rate Last Dose  . 0.9 % NaCl with KCl 20 mEq/ L  infusion   Intravenous Continuous Joseph ArtVann, Jessica U, DO 75 mL/hr at 07/04/17 2203    . acetaminophen (TYLENOL) tablet 650 mg  650 mg Oral Q6H PRN Esperanza SheetsBuriev, Ulugbek N, MD   650 mg at 07/03/17 2207   Or  . acetaminophen (TYLENOL) suppository 650 mg  650 mg Rectal Q6H PRN Esperanza SheetsBuriev, Ulugbek N, MD      . aspirin EC tablet 81 mg  81 mg Oral Daily Esperanza SheetsBuriev, Ulugbek N, MD   81 mg at 07/04/17 0929  . cefTRIAXone (ROCEPHIN) 2 g in sodium chloride 0.9 % 100 mL IVPB  2  g Intravenous Q24H Phylliss Blakes, [facility]   Stopped at 07/04/17 9147  . cholecalciferol (VITAMIN D) tablet 1,000 Units  1,000 Units Oral Daily Esperanza Sheets, MD   1,000 Units at 07/04/17 0929  . feeding supplement (BOOST / RESOURCE BREEZE) liquid 1 Container  1 Container Oral TID BM Esperanza Sheets, MD   1 Container at 07/04/17 1447  . metroNIDAZOLE (FLAGYL) IVPB 500 mg  500 mg Intravenous Q8H Esperanza Sheets, MD   Stopped at 07/05/17 0724  .  multivitamin with minerals tablet 1 tablet  1 tablet Oral Daily Esperanza Sheets, MD   1 tablet at 07/04/17 0929  . ondansetron (ZOFRAN) injection 4 mg  4 mg Intravenous Q6H PRN Audrea Muscat T, NP   4 mg at 07/05/17 0319  . pantoprazole (PROTONIX) injection 40 mg  40 mg Intravenous Q12H Vann, Jessica U, DO      . potassium chloride (K-DUR,KLOR-CON) CR tablet 30 mEq  30 mEq Oral Once Marlin Canary U, DO        Allergies as of 07/03/2017  . (No Known Allergies)    Family History  Problem Relation Age of Onset  . Diabetes Mother   . Hypertension Mother   . Heart disease Mother   . Cancer Sister        THYROID  . Cancer Paternal Grandmother        UTERINE OR OVARIAN?  Marland Kitchen Hypertension Daughter     Social History   Socioeconomic History  . Marital status: Widowed    Spouse name: Not on file  . Number of children: Not on file  . Years of education: Not on file  . Highest education level: Not on file  Social Needs  . Financial resource strain: Not on file  . Food insecurity - worry: Not on file  . Food insecurity - inability: Not on file  . Transportation needs - medical: Not on file  . Transportation needs - non-medical: Not on file  Occupational History  . Not on file  Tobacco Use  . Smoking status: Former Games developer  . Smokeless tobacco: Never Used  Substance and Sexual Activity  . Alcohol use: No    Alcohol/week: 0.0 oz  . Drug use: No  . Sexual activity: No    Birth control/protection: Surgical    Comment: 1st intercourse 24 yo-1 partner  Other Topics Concern  . Not on file  Social History Narrative  . Not on file    Review of Systems: Positive for: GI: Described in detail in HPI.    Gen: fatigue, weakness,Denies any fever, chills, rigors, night sweats, anorexia,  malaise, involuntary weight loss, and sleep disorder CV: Denies chest pain, angina, palpitations, syncope, orthopnea, PND, peripheral edema, and claudication. Resp: Denies dyspnea, cough, sputum,  wheezing, coughing up blood. GU : Denies urinary burning, blood in urine, urinary frequency, urinary hesitancy, nocturnal urination, and urinary incontinence. MS: Denies joint pain or swelling.  Denies muscle weakness, cramps, atrophy.  Derm: Denies rash, itching, oral ulcerations, hives, unhealing ulcers.  Psych: Denies depression, anxiety, memory loss, suicidal ideation, hallucinations,  and confusion. Heme: Small amount of blood in stool , Denies bruising and enlarged lymph nodes. Neuro:  Denies any headaches, dizziness, paresthesias. Endo:  Denies any problems with DM, thyroid, adrenal function.  Physical Exam: Vital signs in last 24 hours: Temp:  [98.7 F (37.1 C)-99.3 F (37.4 C)] 99 F (37.2 C) (02/19 0821) Pulse Rate:  [78-88] 81 (02/19 0951) Resp:  [16-18] 16 (  02/19 0821) BP: (90-125)/(64-85) 118/85 (02/19 0951) SpO2:  [96 %-97 %] 96 % (02/19 0821) Last BM Date: 07/05/17  General:   Alert,  (cerebral palsy), mild involuntary generalized shaking, thinly built, pleasant and cooperative in NAD Head:  Normocephalic and atraumatic. Eyes:  Sclera clear, no icterus.   Mild pallor Ears:  Normal auditory acuity. Nose:  No deformity, discharge,  or lesions. Mouth:  No deformity or lesions.  Oropharynx pink & moist. Neck:  Supple; no masses or thyromegaly. Lungs:  Clear throughout to auscultation.   No wheezes, crackles, or rhonchi. No acute distress. Heart:  Regular rate and rhythm; no murmurs, clicks, rubs,  or gallops. Extremities:  Without clubbing or edema. Neurologic:  Alert and  oriented x4;  grossly normal neurologically. Skin:  Intact without significant lesions or rashes. Psych:  Alert and cooperative. Normal mood and affect. Abdomen:  Soft, nontender and mild distention, tympanitic on percussion, No masses, hepatosplenomegaly or hernias noted. Normal bowel sounds, without guarding, and without rebound.         Lab Results: Recent Labs    07/03/17 0929 07/05/17 0443   WBC 9.2 8.0  HGB 11.7* 9.2*  HCT 33.3* 26.2*  PLT 229 208   BMET Recent Labs    07/03/17 0929 07/05/17 0443  NA 135 137  K 3.5 3.3*  CL 95* 104  CO2 28 24  GLUCOSE 119* 108*  BUN 14 8  CREATININE 1.38* 0.99  CALCIUM 9.2 7.9*   LFT Recent Labs    07/03/17 0929  PROT 7.1  ALBUMIN 3.7  AST 70*  ALT 47  ALKPHOS 88  BILITOT 0.7   PT/INR No results for input(s): LABPROT, INR in the last 72 hours.  Studies/Results: Ct Abdomen Pelvis W Contrast  Result Date: 07/03/2017 CLINICAL DATA:  Persistent nausea and vomiting. EXAM: CT ABDOMEN AND PELVIS WITH CONTRAST TECHNIQUE: Multidetector CT imaging of the abdomen and pelvis was performed using the standard protocol following bolus administration of intravenous contrast. CONTRAST:  80mL ISOVUE-300 IOPAMIDOL (ISOVUE-300) INJECTION 61%, 30mL ISOVUE-300 IOPAMIDOL (ISOVUE-300) INJECTION 61% COMPARISON:  None. FINDINGS: Lower chest: No acute abnormality. Hepatobiliary: Subcentimeter low-density lesion in the left hepatic lobe is too small to characterize. No other focal liver abnormality. Gallbladder is unremarkable. No biliary dilatation. Pancreas: Unremarkable. No pancreatic ductal dilatation or surrounding inflammatory changes. Spleen: Normal in size without focal abnormality. Adrenals/Urinary Tract: The adrenal glands are unremarkable. 1.1 cm cyst in the midpole of the left kidney. The kidneys are otherwise unremarkable. No renal or ureteral calculi. No hydronephrosis. The bladder is unremarkable. Stomach/Bowel: The stomach and small bowel are unremarkable. There is mild wall thickening and stranding surrounding the mid ascending colon. No bowel obstruction. The appendix is not identified. Vascular/Lymphatic: Aortic atherosclerosis. No enlarged abdominal or pelvic lymph nodes. Reproductive: Status post hysterectomy. The right ovary is unremarkable. There is a 1.5 cm cystic lesion in the left ovary, benign. Other: Trace free fluid in the right  paracolic gutter and pelvis. No drainable fluid collection or pneumoperitoneum. Musculoskeletal: No acute or significant osseous findings. Degenerative changes of the lower lumbar spine. IMPRESSION: 1. Focal wall thickening of the mid ascending colon with surrounding inflammatory changes is suspicious for inflammatory or infectious colitis. In the non-urgent setting following complete resolution of the patient's symptoms, colonoscopy may be warranted to exclude the unlikely possibility of an underlying neoplastic lesion. 2.  Aortic atherosclerosis (ICD10-I70.0). Electronically Signed   By: Obie Dredge M.D.   On: 07/03/2017 11:29    Impression: 1. Ascending  colon colitis, diarrhea, mucus and blood in stool? Ischemic colitis C. difficile negative GI pathogen panel negative  2. Drop in hemoglobin from 11.6-9.2  Plan: 1. Clear Liquid diet, advance as tolerated  2. On IV ceftriaxone and IV Flagyl, to be continued for a total of 7-10 days.  3. H&H monitoring and transfusion as needed. Plan colonoscopy as an outpatient in 4-6 weeks.   LOS: 1 day   Kerin Salen, M.D.  07/05/2017, 10:23 AM  Pager [phone] If no answer or after 5 PM call [phone]

## 2017-07-05 NOTE — Evaluation (Signed)
Physical Therapy Evaluation Patient Details Name: Wendy Lewis MRN: [id number] DOB: March 27, 1941 Today's Date: 07/05/2017   History of Present Illness   77 y.o. female with Cerebral Palsy, HTN, CKD, presented with non radiating, mild intermittent lower abdominal pains, associated with nausea for 1 weeks. She reports on episode of vomiting and few episodes of loose stools at home. She reports progressive generalized weakness with poor appetite, unable to drink or eat well. Dx colitis, ? GIB  Clinical Impression  Pt is independent with mobility, she ambulated 200' without an assistive device, no loss of balance. No further PT indicated, will sign off. Pt is ready to DC home from PT standpoint. Her sister will be staying with her to assist prn.      Follow Up Recommendations No PT follow up    Equipment Recommendations  None recommended by PT    Recommendations for Other Services       Precautions / Restrictions Precautions Precautions: None Precaution Comments: 1 fall in past year (her blind dog ran underneath her) Restrictions Weight Bearing Restrictions: No      Mobility  Bed Mobility Overal bed mobility: Independent                Transfers Overall transfer level: Independent                  Ambulation/Gait Ambulation/Gait assistance: Independent Ambulation Distance (Feet): 200 Feet Assistive device: None Gait Pattern/deviations: WFL(Within Functional Limits)   Gait velocity interpretation: at or above normal speed for age/gender General Gait Details: no LOB  Stairs            Wheelchair Mobility    Modified Rankin (Stroke Patients Only)       Balance Overall balance assessment: Independent                                           Pertinent Vitals/Pain Pain Assessment: No/denies pain    Home Living Family/patient expects to be discharged to:: Private residence Living Arrangements: Alone Available Help at  Discharge: Family;Available 24 hours/day(sister coming to stay with pt)   Home Access: Stairs to enter   Entrance Stairs-Number of Steps: 1 Home Layout: One level Home Equipment: None      Prior Function Level of Independence: Independent         Comments: drives, walks without AD     Hand Dominance        Extremity/Trunk Assessment   Upper Extremity Assessment Upper Extremity Assessment: Overall WFL for tasks assessed    Lower Extremity Assessment Lower Extremity Assessment: Overall WFL for tasks assessed       Communication   Communication: No difficulties  Cognition Arousal/Alertness: Awake/alert Behavior During Therapy: WFL for tasks assessed/performed Overall Cognitive Status: Within Functional Limits for tasks assessed                                        General Comments      Exercises     Assessment/Plan    PT Assessment Patent does not need any further PT services  PT Problem List         PT Treatment Interventions      PT Goals (Current goals can be found in the Care Plan section)  Acute Rehab PT Goals  Patient Stated Goal: go home to her dog, play computer games PT Goal Formulation: All assessment and education complete, DC therapy    Frequency     Barriers to discharge        Co-evaluation               AM-PAC PT "6 Clicks" Daily Activity  Outcome Measure Difficulty turning over in bed (including adjusting bedclothes, sheets and blankets)?: None Difficulty moving from lying on back to sitting on the side of the bed? : None Difficulty sitting down on and standing up from a chair with arms (e.g., wheelchair, bedside commode, etc,.)?: None Help needed moving to and from a bed to chair (including a wheelchair)?: None Help needed walking in hospital room?: None Help needed climbing 3-5 steps with a railing? : None 6 Click Score: 24    End of Session Equipment Utilized During Treatment: Gait belt Activity  Tolerance: Patient tolerated treatment well Patient left: in chair;with call bell/phone within reach;with nursing/sitter in room Nurse Communication: Mobility status      Time: [id number] PT Time Calculation (min) (ACUTE ONLY): 13 min   Charges:   PT Evaluation $PT Eval Low Complexity: 1 Low     PT G Codes:          Wendy Lewis, Wendy Lewis 07/05/2017, 10:41 AM [phone]

## 2017-07-05 NOTE — Progress Notes (Signed)
Nutrition Brief Note  Patient identified on the Malnutrition Screening Tool (MST) Report  Weight is stable. Tolerating clears. Reports no unintentional weight loss and wants diet advanced.  Wt Readings from Last 15 Encounters:  07/03/17 100 lb 5 oz (45.5 kg)  06/03/17 104 lb (47.2 kg)  03/01/17 105 lb (47.6 kg)  05/19/16 105 lb (47.6 kg)  05/15/15 105 lb (47.6 kg)  05/03/14 107 lb (48.5 kg)  05/01/13 107 lb (48.5 kg)  04/18/12 108 lb (49 kg)  03/09/11 101 lb (45.8 kg)    Body mass index is 20.26 kg/m. Patient meets criteria for normal based on current BMI.   Current diet order is clears, patient is consuming approximately 100% of meals at this time. Labs and medications reviewed.   No nutrition interventions warranted at this time. If nutrition issues arise, please consult RD.   Tilda FrancoLindsey Jena Tegeler, MS, RD, LDN [facility] Clinical Dietitian Pager: [phone] After Hours Pager: [phone]

## 2017-07-06 DIAGNOSIS — D649 Anemia, unspecified: Secondary | ICD-10-CM

## 2017-07-06 LAB — CBC
HCT: 24.8 % — ABNORMAL LOW (ref 36.0–46.0)
HCT: 27.4 % — ABNORMAL LOW (ref 36.0–46.0)
Hemoglobin: 8.6 g/dL — ABNORMAL LOW (ref 12.0–15.0)
Hemoglobin: 9.1 g/dL — ABNORMAL LOW (ref 12.0–15.0)
MCH: 31.4 pg (ref 26.0–34.0)
MCH: 30 pg (ref 26.0–34.0)
MCHC: 34.7 g/dL (ref 30.0–36.0)
MCHC: 33.2 g/dL (ref 30.0–36.0)
MCV: 90.5 fL (ref 78.0–100.0)
MCV: 90.4 fL (ref 78.0–100.0)
PLATELETS: 224 10*3/uL (ref 150–400)
PLATELETS: 258 10*3/uL (ref 150–400)
RBC: 2.74 MIL/uL — AB (ref 3.87–5.11)
RBC: 3.03 MIL/uL — ABNORMAL LOW (ref 3.87–5.11)
RDW: 13.4 % (ref 11.5–15.5)
RDW: 13.4 % (ref 11.5–15.5)
WBC: 6.9 10*3/uL (ref 4.0–10.5)
WBC: 7.8 10*3/uL (ref 4.0–10.5)

## 2017-07-06 LAB — BASIC METABOLIC PANEL
Anion gap: 8 (ref 5–15)
BUN: 5 mg/dL — AB (ref 6–20)
CALCIUM: 8 mg/dL — AB (ref 8.9–10.3)
CO2: 23 mmol/L (ref 22–32)
CREATININE: 0.99 mg/dL (ref 0.44–1.00)
Chloride: 108 mmol/L (ref 101–111)
GFR calc Af Amer: >60 mL/min (ref >60)
GFR, EST NON AFRICAN AMERICAN: 54 mL/min — AB (ref >60)
Glucose, Bld: 100 mg/dL — ABNORMAL HIGH (ref 65–99)
Potassium: 4.2 mmol/L (ref 3.5–5.1)
SODIUM: 139 mmol/L (ref 135–145)

## 2017-07-06 MED ORDER — SALINE SPRAY 0.65 % NA SOLN
1.0000 | NASAL | Status: DC | PRN
  Administered 2017-07-06: 1 via NASAL
  Filled 2017-07-06: qty 44

## 2017-07-06 MED ORDER — RISAQUAD PO CAPS
1.0000 | ORAL_CAPSULE | Freq: Every day | ORAL | Status: DC
  Administered 2017-07-06 – 2017-07-08 (×3): 1 via ORAL
  Filled 2017-07-06 (×3): qty 1

## 2017-07-06 MED ORDER — MAGNESIUM SULFATE 2 GM/50ML IV SOLN
2.0000 g | Freq: Once | INTRAVENOUS | Status: AC
  Administered 2017-07-06: 2 g via INTRAVENOUS
  Filled 2017-07-06: qty 50

## 2017-07-06 NOTE — Progress Notes (Signed)
PROGRESS NOTE    SHALEE PAOLO  ZOX:[id number] DOB: [birth year] DOA: 07/03/2017 PCP: [name], MD   Outpatient Specialists:     Brief Narrative:  Wendy Lewis is a 77 y.o. female with Cerebral Palsy, HTN presented with non radiating, mild intermittent lower abdominal pains, associated with nausea for 1 weeks. She reports on episode of vomiting and few episodes of loose stools at home. She reports progressive generalized weakness with poor appetite, unable to drink or eat well. She denies fevers, had some chills. No hematemesis, no hematochezia. No shortness of breath, she reports chronic musculoskeletal chest pains. No exertional symptoms. No sick contacts  -ED: ct abd showed colitis. hospitalis is called for admission.  On the floor, patient had dark stools with a drop in Hgb worrisome for GI bleed.  But on 2/20 seems to be slowly improving     Assessment & Plan:   Active Problems:   CP (cerebral palsy) (HCC)   Hypertension   Colitis   Acute colitis. Likely infectious colitis with ? GI bleed (dark stools and drop in Hgb) -CT abd: Focal wall thickening of the mid ascending colon with surrounding inflammatory changes is suspicious for inflammatory or infectious colitis.  -will treat with empiric antibiotics for possible infectious colitis x 10 days -GI consult appreciated-- no further dark stools-- can follow up as an outpatient with Dr. Randa Evens -c diff negative -GI pathogen panel negative  Anemia -unknown cause -trend -further work up as outpateint  HTN -hold ACE for low BP  AKI -improved with IVF and holding ACE  cerebral palsy -PT eval -patient lives at home with her blind dog  Cough -no sign of PNA on imaging -incentive spirometry  Hypokalemia -replete along with Magnesium  DVT prophylaxis:  SCD's  Code Status: Full Code   Family Communication: Offered to call family-- patient declined  Disposition Plan:     Consultants:   GI  Subjective: Feeling much better today  Objective: Vitals:   07/05/17 1331 07/05/17 2108 07/06/17 0612 07/06/17 1300  BP: (!) 159/70 131/85 106/70 (!) 152/69  Pulse: 84 86 66 85  Resp: 17 16 16 18   Temp: 98.6 F (37 C) 99.6 F (37.6 C) 98.5 F (36.9 C) 98.5 F (36.9 C)  TempSrc: Oral Oral Oral Oral  SpO2: 97% 96% 98% 97%  Weight:      Height:        Intake/Output Summary (Last 24 hours) at 07/06/2017 1328 Last data filed at 07/06/2017 1000 Gross per 24 hour  Intake 2460 ml  Output -  Net 2460 ml   Filed Weights   07/03/17 1629  Weight: 45.5 kg (100 lb 5 oz)    Examination:  General exam: NAD, in bed Respiratory system: clear, no wheezing Cardiovascular system: rrr Gastrointestinal system: +BS, soft Central nervous system: fine tremors Extremities: moves all 4 ext Psychiatry: normal mood    Data Reviewed: I have personally reviewed following labs and imaging studies  CBC: Recent Labs  Lab 06/29/17 2128 07/03/17 0929 07/05/17 0443 07/05/17 1550 07/06/17 0409  WBC 9.0 9.2 8.0 9.1 6.9  NEUTROABS 8.3* 7.4  --   --   --   HGB 11.6* 11.7* 9.2* 9.1* 8.6*  HCT 33.8* 33.3* 26.2* 26.1* 24.8*  MCV 91.1 88.6 89.1 89.7 90.5  PLT 215 229 208 241 224   Basic Metabolic Panel: Recent Labs  Lab 06/29/17 2128 07/03/17 0929 07/05/17 0443 07/06/17 0410  NA 133* 135 137 139  K 4.2 3.5 3.3* 4.2  CL 97* 95* 104 108  CO2 25 28 24 23   GLUCOSE 149* 119* 108* 100*  BUN 20 14 8  5*  CREATININE 1.33* 1.38* 0.99 0.99  CALCIUM 9.0 9.2 7.9* 8.0*  MG  --   --  1.6*  --    GFR: Estimated Creatinine Clearance: 33.5 mL/min (by C-G formula based on SCr of 0.99 mg/dL). Liver Function Tests: Recent Labs  Lab 06/29/17 2128 07/03/17 0929  AST 37 70*  ALT 20 47  ALKPHOS 78 88  BILITOT 1.0 0.7  PROT 7.6 7.1  ALBUMIN 4.0 3.7   Recent Labs  Lab 07/03/17 0929  LIPASE 24   No results for input(s): AMMONIA in the last 168 hours. Coagulation Profile: No results for  input(s): INR, PROTIME in the last 168 hours. Cardiac Enzymes: No results for input(s): CKTOTAL, CKMB, CKMBINDEX, TROPONINI in the last 168 hours. BNP (last 3 results) No results for input(s): PROBNP in the last 8760 hours. HbA1C: No results for input(s): HGBA1C in the last 72 hours. CBG: No results for input(s): GLUCAP in the last 168 hours. Lipid Profile: No results for input(s): CHOL, HDL, LDLCALC, TRIG, CHOLHDL, LDLDIRECT in the last 72 hours. Thyroid Function Tests: No results for input(s): TSH, T4TOTAL, FREET4, T3FREE, THYROIDAB in the last 72 hours. Anemia Panel: No results for input(s): VITAMINB12, FOLATE, FERRITIN, TIBC, IRON, RETICCTPCT in the last 72 hours. Urine analysis:    Component Value Date/Time   COLORURINE YELLOW 07/03/2017 1145   APPEARANCEUR CLEAR 07/03/2017 1145   LABSPEC 1.020 07/03/2017 1145   PHURINE 5.0 07/03/2017 1145   GLUCOSEU NEGATIVE 07/03/2017 1145   HGBUR NEGATIVE 07/03/2017 1145   BILIRUBINUR NEGATIVE 07/03/2017 1145   KETONESUR 5 (A) 07/03/2017 1145   PROTEINUR NEGATIVE 07/03/2017 1145   NITRITE NEGATIVE 07/03/2017 1145   LEUKOCYTESUR MODERATE (A) 07/03/2017 1145       Recent Results (from the past 240 hour(s))  C difficile quick scan w PCR reflex     Status: None   Collection Time: 07/03/17  1:54 PM  Result Value Ref Range Status   C Diff antigen NEGATIVE NEGATIVE Final   C Diff toxin NEGATIVE NEGATIVE Final   C Diff interpretation No C. difficile detected.  Final    Comment: Performed at [facility], [address] [place], [place] [address]  Gastrointestinal Panel by PCR , Stool     Status: None   Collection Time: 07/03/17  1:55 PM  Result Value Ref Range Status   Campylobacter species NOT DETECTED NOT DETECTED Final   Plesimonas shigelloides NOT DETECTED NOT DETECTED Final   Salmonella species NOT DETECTED NOT DETECTED Final   Yersinia enterocolitica NOT DETECTED NOT DETECTED Final   Vibrio species NOT  DETECTED NOT DETECTED Final   Vibrio cholerae NOT DETECTED NOT DETECTED Final   Enteroaggregative E coli (EAEC) NOT DETECTED NOT DETECTED Final   Enteropathogenic E coli (EPEC) NOT DETECTED NOT DETECTED Final   Enterotoxigenic E coli (ETEC) NOT DETECTED NOT DETECTED Final   Shiga like toxin producing E coli (STEC) NOT DETECTED NOT DETECTED Final   Shigella/Enteroinvasive E coli (EIEC) NOT DETECTED NOT DETECTED Final   Cryptosporidium NOT DETECTED NOT DETECTED Final   Cyclospora cayetanensis NOT DETECTED NOT DETECTED Final   Entamoeba histolytica NOT DETECTED NOT DETECTED Final   Giardia lamblia NOT DETECTED NOT DETECTED Final   Adenovirus F40/41 NOT DETECTED NOT DETECTED Final   Astrovirus NOT DETECTED NOT DETECTED Final   Norovirus GI/GII NOT DETECTED NOT DETECTED Final  Rotavirus A NOT DETECTED NOT DETECTED Final   Sapovirus (I, II, IV, and V) NOT DETECTED NOT DETECTED Final    Comment: Performed at [facility], [address] [place], [place] [address]      Anti-infectives (From admission, onward)   Start     Dose/Rate Route Frequency Ordered Stop   07/04/17 1000  cefTRIAXone (ROCEPHIN) 2 g in sodium chloride 0.9 % 100 mL IVPB     2 g 200 mL/hr over 30 Minutes Intravenous Every 24 hours 07/03/17 1400     07/03/17 2200  metroNIDAZOLE (FLAGYL) IVPB 500 mg     500 mg 100 mL/hr over 60 Minutes Intravenous Every 8 hours 07/03/17 1354     07/03/17 1200  ciprofloxacin (CIPRO) IVPB 400 mg     400 mg 200 mL/hr over 60 Minutes Intravenous  Once 07/03/17 1152 07/03/17 1400   07/03/17 1200  metroNIDAZOLE (FLAGYL) IVPB 500 mg     500 mg 100 mL/hr over 60 Minutes Intravenous  Once 07/03/17 1152 07/03/17 1641       Radiology Studies: No results found.      Scheduled Meds: . acidophilus  1 capsule Oral Daily  . aspirin EC  81 mg Oral Daily  . cholecalciferol  1,000 Units Oral Daily  . feeding supplement  1 Container Oral TID BM  . multivitamin with minerals  1 tablet  Oral Daily  . pantoprazole (PROTONIX) IV  40 mg Intravenous Q12H   Continuous Infusions: . 0.9 % NaCl with KCl 20 mEq / L 75 mL/hr at 07/06/17 1126  . cefTRIAXone (ROCEPHIN)  IV Stopped (07/06/17 1050)  . metronidazole 500 mg (07/06/17 0543)     LOS: 2 days    Time spent: 35 min    Joseph Art, DO [facility] Pager [phone]  If 7PM-7AM, please contact night-coverage [email or web address] Password [email or web address] 07/06/2017, 1:28 PM

## 2017-07-06 NOTE — Progress Notes (Signed)
Subjective: Abdominal discomfort improving. Denies blood in stool or ongoing diarrhea.  Objective: Vital signs in last 24 hours: Temp:  [98.5 F (36.9 C)-99.6 F (37.6 C)] 98.5 F (36.9 C) (02/20 0612) Pulse Rate:  [66-86] 66 (02/20 0612) Resp:  [16-17] 16 (02/20 0612) BP: (106-159)/(70-85) 106/70 (02/20 0612) SpO2:  [96 %-98 %] 98 % (02/20 0612) Weight change:  Last BM Date: 07/05/17  PE: GEN:  Thin, somewhat cachectic-appearing but NAD ABD:  Soft, mild generalized tenderness without peritonitis  Lab Results: CBC    Component Value Date/Time   WBC 6.9 07/06/2017 0409   RBC 2.74 (L) 07/06/2017 0409   HGB 8.6 (L) 07/06/2017 0409   HCT 24.8 (L) 07/06/2017 0409   PLT 224 07/06/2017 0409   MCV 90.5 07/06/2017 0409   MCH 31.4 07/06/2017 0409   MCHC 34.7 07/06/2017 0409   RDW 13.4 07/06/2017 0409   LYMPHSABS 1.2 07/03/2017 0929   MONOABS 0.6 07/03/2017 0929   EOSABS 0.0 07/03/2017 0929   BASOSABS 0.0 07/03/2017 0929   CMP     Component Value Date/Time   NA 139 07/06/2017 0410   K 4.2 07/06/2017 0410   CL 108 07/06/2017 0410   CO2 23 07/06/2017 0410   GLUCOSE 100 (H) 07/06/2017 0410   BUN 5 (L) 07/06/2017 0410   CREATININE 0.99 07/06/2017 0410   CALCIUM 8.0 (L) 07/06/2017 0410   PROT 7.1 07/03/2017 0929   ALBUMIN 3.7 07/03/2017 0929   AST 70 (H) 07/03/2017 0929   ALT 47 07/03/2017 0929   ALKPHOS 88 07/03/2017 0929   BILITOT 0.7 07/03/2017 0929   GFRNONAA 54 (L) 07/06/2017 0410   GFRAA >60 07/06/2017 0410    Assessment:  1.  Abdominal discomfort with colitis.  Infectious versus ischemic most likely.  Doubt IBD.  Stool studies negative. 2.  Anemia, downtrending a few grams but with no overt GI bleeding.  Plan:  1.  Advance diet as tolerated. 2.  Mobilize, ambulate as patient is able. 3.  IV antibiotics today; once she can advance more diet, would transition to oral antibiotics, for total 10-day course of antibiotic therapy. 4.  Hopefully home in the next  day or two.  Might consider updated outpatient colonoscopy with her GI MD (Dr. Randa EvensEdwards) in a few weeks. 5.  [facility] will follow.   Freddy JakschOUTLAW,Chad Donoghue M 07/06/2017, 8:59 AM   Cell [phone] If no answer or after 5 PM call [phone]

## 2017-07-07 LAB — CBC
HEMATOCRIT: 25 % — AB (ref 36.0–46.0)
HEMOGLOBIN: 8.4 g/dL — AB (ref 12.0–15.0)
MCH: 30.4 pg (ref 26.0–34.0)
MCHC: 33.6 g/dL (ref 30.0–36.0)
MCV: 90.6 fL (ref 78.0–100.0)
Platelets: 263 10*3/uL (ref 150–400)
RBC: 2.76 MIL/uL — ABNORMAL LOW (ref 3.87–5.11)
RDW: 13.5 % (ref 11.5–15.5)
WBC: 8.1 10*3/uL (ref 4.0–10.5)

## 2017-07-07 LAB — BASIC METABOLIC PANEL
Anion gap: 6 (ref 5–15)
BUN: <5 mg/dL — ABNORMAL LOW (ref 6–20)
CALCIUM: 7.5 mg/dL — AB (ref 8.9–10.3)
CHLORIDE: 108 mmol/L (ref 101–111)
CO2: 22 mmol/L (ref 22–32)
CREATININE: 0.96 mg/dL (ref 0.44–1.00)
GFR calc Af Amer: >60 mL/min (ref >60)
GFR calc non Af Amer: 56 mL/min — ABNORMAL LOW (ref >60)
GLUCOSE: 98 mg/dL (ref 65–99)
Potassium: 4.3 mmol/L (ref 3.5–5.1)
Sodium: 136 mmol/L (ref 135–145)

## 2017-07-07 MED ORDER — LISINOPRIL-HYDROCHLOROTHIAZIDE 20-25 MG PO TABS: 1.0000 | ORAL_TABLET | Freq: Every day | ORAL | Status: DC

## 2017-07-07 MED ORDER — CEFUROXIME AXETIL 250 MG PO TABS
250.0000 mg | ORAL_TABLET | Freq: Two times a day (BID) | ORAL | Status: DC
  Administered 2017-07-07 – 2017-07-08 (×3): 250 mg via ORAL
  Filled 2017-07-07 (×5): qty 1

## 2017-07-07 MED ORDER — HYDROCHLOROTHIAZIDE 25 MG PO TABS
25.0000 mg | ORAL_TABLET | Freq: Every day | ORAL | Status: DC
  Administered 2017-07-07 – 2017-07-08 (×2): 25 mg via ORAL
  Filled 2017-07-07 (×2): qty 1

## 2017-07-07 MED ORDER — METRONIDAZOLE 500 MG PO TABS
500.0000 mg | ORAL_TABLET | Freq: Three times a day (TID) | ORAL | Status: DC
  Administered 2017-07-07 – 2017-07-08 (×4): 500 mg via ORAL
  Filled 2017-07-07 (×4): qty 1

## 2017-07-07 MED ORDER — PANTOPRAZOLE SODIUM 40 MG PO TBEC
40.0000 mg | DELAYED_RELEASE_TABLET | Freq: Two times a day (BID) | ORAL | Status: DC
  Administered 2017-07-07 – 2017-07-08 (×2): 40 mg via ORAL
  Filled 2017-07-07 (×3): qty 1

## 2017-07-07 MED ORDER — LISINOPRIL 20 MG PO TABS
20.0000 mg | ORAL_TABLET | Freq: Every day | ORAL | Status: DC
  Administered 2017-07-07 – 2017-07-08 (×2): 20 mg via ORAL
  Filled 2017-07-07 (×2): qty 1

## 2017-07-07 NOTE — Care Management Important Message (Signed)
Important Message  Patient Details  Name: Anne Fuhyllis P Testerman MRN: [id number] Date of Birth: 07-Jan-1941   Medicare Important Message Given:  Yes    Caren MacadamFuller, Ravenna Legore 07/07/2017, 10:20 AMImportant Message  Patient Details  Name: Anne Fuhyllis P Mulroy MRN: [id number] Date of Birth: 07-Jan-1941   Medicare Important Message Given:  Yes    Caren MacadamFuller, Chandrika Sandles 07/07/2017, 10:20 AM

## 2017-07-07 NOTE — Progress Notes (Signed)
PROGRESS NOTE    Wendy Fuhyllis P Hari  WUJ:[id number] DOB: 1940/12/16 DOA: 07/03/2017 PCP: Laurann MontanaWhite, Cynthia, MD   Outpatient Specialists:     Brief Narrative:  Wendy Lewis is a 77 y.o. female with Cerebral Palsy, HTN presented with non radiating, mild intermittent lower abdominal pains, associated with nausea for 1 weeks. She reports on episode of vomiting and few episodes of loose stools at home. She reports progressive generalized weakness with poor appetite, unable to drink or eat well. ct abd showed colitis.  On the floor, patient had dark stools with a drop in Hgb worrisome for GI bleed.  But on 2/20 seems to be slowly improving     Assessment & Plan:   Active Problems:   CP (cerebral palsy) (HCC)   Hypertension   Colitis   Acute colitis. Likely infectious colitis with ? GI bleed (dark stools and drop in Hgb) -CT abd: Focal wall thickening of the mid ascending colon with surrounding inflammatory changes is suspicious for inflammatory or infectious colitis.  -will treat with empiric antibiotics for possible infectious colitis x 10 days -GI consult appreciated-- no further dark stools-- can follow up as an outpatient with Dr. Randa EvensEdwards -c diff negative -GI pathogen panel negative  Anemia -unknown cause -trend -further work up as outpateint  HTN -resume home meds  AKI -resolved, monitor  cerebral palsy -PT eval -patient lives at home with her blind dog  Cough -no sign of PNA on imaging -incentive spirometry -protonix for possible GERD  Hypokalemia -replete along with Magnesium  DVT prophylaxis:  SCD's  Code Status: Full Code   Family Communication: Offered to call family-- patient declined  Disposition Plan:  Home in AM?   Consultants:  GI  Subjective: No overnight event-- eating some but not much per patient  Objective: Vitals:   07/06/17 1300 07/06/17 2126 07/07/17 0602 07/07/17 1022  BP: (!) 152/69 130/84 122/90 128/78  Pulse: 85 93  94 95  Resp: 18 17 18    Temp: 98.5 F (36.9 C) 99.3 F (37.4 C) 98.9 F (37.2 C) 98 F (36.7 C)  TempSrc: Oral Oral Oral Oral  SpO2: 97% 96% 94% 97%  Weight:      Height:        Intake/Output Summary (Last 24 hours) at 07/07/2017 1332 Last data filed at 07/07/2017 0200 Gross per 24 hour  Intake 1290 ml  Output 0 ml  Net 1290 ml   Filed Weights   07/03/17 1629  Weight: 45.5 kg (100 lb 5 oz)    Examination:  General exam: in bed, no increase work of breathing Respiratory system: expiratory wheezing Cardiovascular system: rrr Gastrointestinal system: +BS, soft Central nervous system: fine tremors Extremities: minimal swelling in hands     Data Reviewed: I have personally reviewed following labs and imaging studies  CBC: Recent Labs  Lab 07/03/17 0929 07/05/17 0443 07/05/17 1550 07/06/17 0409 07/06/17 1549 07/07/17 0407  WBC 9.2 8.0 9.1 6.9 7.8 8.1  NEUTROABS 7.4  --   --   --   --   --   HGB 11.7* 9.2* 9.1* 8.6* 9.1* 8.4*  HCT 33.3* 26.2* 26.1* 24.8* 27.4* 25.0*  MCV 88.6 89.1 89.7 90.5 90.4 90.6  PLT 229 208 241 224 258 263   Basic Metabolic Panel: Recent Labs  Lab 07/03/17 0929 07/05/17 0443 07/06/17 0410 07/07/17 0407  NA 135 137 139 136  K 3.5 3.3* 4.2 4.3  CL 95* 104 108 108  CO2 28 24 23 22   GLUCOSE  119* 108* 100* 98  BUN 14 8 5* <5*  CREATININE 1.38* 0.99 0.99 0.96  CALCIUM 9.2 7.9* 8.0* 7.5*  MG  --  1.6*  --   --    GFR: Estimated Creatinine Clearance: 34.5 mL/min (by C-G formula based on SCr of 0.96 mg/dL). Liver Function Tests: Recent Labs  Lab 07/03/17 0929  AST 70*  ALT 47  ALKPHOS 88  BILITOT 0.7  PROT 7.1  ALBUMIN 3.7   Recent Labs  Lab 07/03/17 0929  LIPASE 24   No results for input(s): AMMONIA in the last 168 hours. Coagulation Profile: No results for input(s): INR, PROTIME in the last 168 hours. Cardiac Enzymes: No results for input(s): CKTOTAL, CKMB, CKMBINDEX, TROPONINI in the last 168 hours. BNP (last 3  results) No results for input(s): PROBNP in the last 8760 hours. HbA1C: No results for input(s): HGBA1C in the last 72 hours. CBG: No results for input(s): GLUCAP in the last 168 hours. Lipid Profile: No results for input(s): CHOL, HDL, LDLCALC, TRIG, CHOLHDL, LDLDIRECT in the last 72 hours. Thyroid Function Tests: No results for input(s): TSH, T4TOTAL, FREET4, T3FREE, THYROIDAB in the last 72 hours. Anemia Panel: No results for input(s): VITAMINB12, FOLATE, FERRITIN, TIBC, IRON, RETICCTPCT in the last 72 hours. Urine analysis:    Component Value Date/Time   COLORURINE YELLOW 07/03/2017 1145   APPEARANCEUR CLEAR 07/03/2017 1145   LABSPEC 1.020 07/03/2017 1145   PHURINE 5.0 07/03/2017 1145   GLUCOSEU NEGATIVE 07/03/2017 1145   HGBUR NEGATIVE 07/03/2017 1145   BILIRUBINUR NEGATIVE 07/03/2017 1145   KETONESUR 5 (A) 07/03/2017 1145   PROTEINUR NEGATIVE 07/03/2017 1145   NITRITE NEGATIVE 07/03/2017 1145   LEUKOCYTESUR MODERATE (A) 07/03/2017 1145       Recent Results (from the past 240 hour(s))  C difficile quick scan w PCR reflex     Status: None   Collection Time: 07/03/17  1:54 PM  Result Value Ref Range Status   C Diff antigen NEGATIVE NEGATIVE Final   C Diff toxin NEGATIVE NEGATIVE Final   C Diff interpretation No C. difficile detected.  Final    Comment: Performed at [facility], [address], [place], [place] [address]  Gastrointestinal Panel by PCR , Stool     Status: None   Collection Time: 07/03/17  1:55 PM  Result Value Ref Range Status   Campylobacter species NOT DETECTED NOT DETECTED Final   Plesimonas shigelloides NOT DETECTED NOT DETECTED Final   Salmonella species NOT DETECTED NOT DETECTED Final   Yersinia enterocolitica NOT DETECTED NOT DETECTED Final   Vibrio species NOT DETECTED NOT DETECTED Final   Vibrio cholerae NOT DETECTED NOT DETECTED Final   Enteroaggregative E coli (EAEC) NOT DETECTED NOT DETECTED Final   Enteropathogenic E  coli (EPEC) NOT DETECTED NOT DETECTED Final   Enterotoxigenic E coli (ETEC) NOT DETECTED NOT DETECTED Final   Shiga like toxin producing E coli (STEC) NOT DETECTED NOT DETECTED Final   Shigella/Enteroinvasive E coli (EIEC) NOT DETECTED NOT DETECTED Final   Cryptosporidium NOT DETECTED NOT DETECTED Final   Cyclospora cayetanensis NOT DETECTED NOT DETECTED Final   Entamoeba histolytica NOT DETECTED NOT DETECTED Final   Giardia lamblia NOT DETECTED NOT DETECTED Final   Adenovirus F40/41 NOT DETECTED NOT DETECTED Final   Astrovirus NOT DETECTED NOT DETECTED Final   Norovirus GI/GII NOT DETECTED NOT DETECTED Final   Rotavirus A NOT DETECTED NOT DETECTED Final   Sapovirus (I, II, IV, and V) NOT DETECTED NOT DETECTED  Final    Comment: Performed at [facility], [address] [place], [place] [address]      Anti-infectives (From admission, onward)   Start     Dose/Rate Route Frequency Ordered Stop   07/07/17 1400  metroNIDAZOLE (FLAGYL) tablet 500 mg     500 mg Oral Every 8 hours 07/07/17 1017 07/14/17 1359   07/07/17 1200  cefUROXime (CEFTIN) tablet 250 mg     250 mg Oral 2 times daily with meals 07/07/17 1017 07/14/17 0759   07/04/17 1000  cefTRIAXone (ROCEPHIN) 2 g in sodium chloride 0.9 % 100 mL IVPB  Status:  Discontinued     2 g 200 mL/hr over 30 Minutes Intravenous Every 24 hours 07/03/17 1400 07/07/17 1017   07/03/17 2200  metroNIDAZOLE (FLAGYL) IVPB 500 mg  Status:  Discontinued     500 mg 100 mL/hr over 60 Minutes Intravenous Every 8 hours 07/03/17 1354 07/07/17 1017   07/03/17 1200  ciprofloxacin (CIPRO) IVPB 400 mg     400 mg 200 mL/hr over 60 Minutes Intravenous  Once 07/03/17 1152 07/03/17 1400   07/03/17 1200  metroNIDAZOLE (FLAGYL) IVPB 500 mg     500 mg 100 mL/hr over 60 Minutes Intravenous  Once 07/03/17 1152 07/03/17 1641       Radiology Studies: No results found.      Scheduled Meds: . acidophilus  1 capsule Oral Daily  . aspirin EC  81 mg Oral  Daily  . cefUROXime  250 mg Oral BID WC  . cholecalciferol  1,000 Units Oral Daily  . feeding supplement  1 Container Oral TID BM  . lisinopril  20 mg Oral Daily   And  . hydrochlorothiazide  25 mg Oral Daily  . metroNIDAZOLE  500 mg Oral Q8H  . multivitamin with minerals  1 tablet Oral Daily  . pantoprazole  40 mg Oral BID AC   Continuous Infusions:    LOS: 3 days    Time spent: 25 min    Joseph Art, DO [facility] Pager [phone]  If 7PM-7AM, please contact night-coverage [email or web address] Password [email or web address] 07/07/2017, 1:32 PM

## 2017-07-07 NOTE — Progress Notes (Signed)
Subjective: No abdominal pain. No blood in stool. Tolerating diet.  Objective: Vital signs in last 24 hours: Temp:  [98.5 F (36.9 C)-99.3 F (37.4 C)] 98.9 F (37.2 C) (02/21 0602) Pulse Rate:  [85-94] 94 (02/21 0602) Resp:  [17-18] 18 (02/21 0602) BP: (122-152)/(69-90) 122/90 (02/21 0602) SpO2:  [94 %-97 %] 94 % (02/21 0602) Weight change:  Last BM Date: 07/05/17  PE: GEN:  Thin, somewhat cachectic-appearing, but NAD ABD:  Soft, non tender  Lab Results: CBC    Component Value Date/Time   WBC 8.1 07/07/2017 0407   RBC 2.76 (L) 07/07/2017 0407   HGB 8.4 (L) 07/07/2017 0407   HCT 25.0 (L) 07/07/2017 0407   PLT 263 07/07/2017 0407   MCV 90.6 07/07/2017 0407   MCH 30.4 07/07/2017 0407   MCHC 33.6 07/07/2017 0407   RDW 13.5 07/07/2017 0407   LYMPHSABS 1.2 07/03/2017 0929   MONOABS 0.6 07/03/2017 0929   EOSABS 0.0 07/03/2017 0929   BASOSABS 0.0 07/03/2017 0929   CMP     Component Value Date/Time   NA 136 07/07/2017 0407   K 4.3 07/07/2017 0407   CL 108 07/07/2017 0407   CO2 22 07/07/2017 0407   GLUCOSE 98 07/07/2017 0407   BUN <5 (L) 07/07/2017 0407   CREATININE 0.96 07/07/2017 0407   CALCIUM 7.5 (L) 07/07/2017 0407   PROT 7.1 07/03/2017 0929   ALBUMIN 3.7 07/03/2017 0929   AST 70 (H) 07/03/2017 0929   ALT 47 07/03/2017 0929   ALKPHOS 88 07/03/2017 0929   BILITOT 0.7 07/03/2017 0929   GFRNONAA 56 (L) 07/07/2017 0407   GFRAA >60 07/07/2017 0407   Assessment:  1.  Abdominal discomfort with colitis on CT scan.  Infectious versus ischemic most likely.  Doubt IBD.  Stool studies negative. 2.  Anemia, downtrending a few grams but with no overt GI bleeding.  Plan:  1.  Advance to soft diet. 2.  Transition to oral antibiotics (ceftin 500 mg po bid and metronidazole 500 mg po tid), complete 10-day total course. 3.  Ambulate, OOBTC as tolerated. 4.  OK for patient to be discharged home, from GI perspective, if her functional status if appropriate and if she  tolerates soft diet. 5.  Please upon discharge call our office [facility] [phone]) to set up follow-up visit with Dr. Randa EvensEdwards in 4 weeks; Dr. Randa EvensEdwards may consider outpatient colonoscopy. 6.  [facility] will sign-off; please call with questions; thank you for the consultation.   Freddy JakschOUTLAW,Bernardine Langworthy M 07/07/2017, 10:11 AM   Cell [phone] If no answer or after 5 PM call [phone]

## 2017-07-08 DIAGNOSIS — K529 Noninfective gastroenteritis and colitis, unspecified: Secondary | ICD-10-CM

## 2017-07-08 DIAGNOSIS — I10 Essential (primary) hypertension: Secondary | ICD-10-CM

## 2017-07-08 LAB — BASIC METABOLIC PANEL
Anion gap: 9 (ref 5–15)
BUN: <5 mg/dL — ABNORMAL LOW (ref 6–20)
CHLORIDE: 105 mmol/L (ref 101–111)
CO2: 24 mmol/L (ref 22–32)
CREATININE: 1 mg/dL (ref 0.44–1.00)
Calcium: 8.1 mg/dL — ABNORMAL LOW (ref 8.9–10.3)
GFR, EST NON AFRICAN AMERICAN: 54 mL/min — AB (ref >60)
Glucose, Bld: 102 mg/dL — ABNORMAL HIGH (ref 65–99)
Potassium: 4.1 mmol/L (ref 3.5–5.1)
SODIUM: 138 mmol/L (ref 135–145)

## 2017-07-08 LAB — CBC
HCT: 26.3 % — ABNORMAL LOW (ref 36.0–46.0)
HEMOGLOBIN: 9 g/dL — AB (ref 12.0–15.0)
MCH: 31.4 pg (ref 26.0–34.0)
MCHC: 34.2 g/dL (ref 30.0–36.0)
MCV: 91.6 fL (ref 78.0–100.0)
Platelets: 346 10*3/uL (ref 150–400)
RBC: 2.87 MIL/uL — AB (ref 3.87–5.11)
RDW: 13.8 % (ref 11.5–15.5)
WBC: 7.9 10*3/uL (ref 4.0–10.5)

## 2017-07-08 LAB — MAGNESIUM: MAGNESIUM: 1.7 mg/dL (ref 1.7–2.4)

## 2017-07-08 MED ORDER — METRONIDAZOLE 500 MG PO TABS: 500.0000 mg | ORAL_TABLET | Freq: Three times a day (TID) | ORAL | 0 refills | Status: AC

## 2017-07-08 MED ORDER — CIPROFLOXACIN HCL 250 MG PO TABS: 250.0000 mg | ORAL_TABLET | Freq: Two times a day (BID) | ORAL | 0 refills | Status: AC

## 2017-07-08 NOTE — Progress Notes (Signed)
Discharge instructions reviewed with patient. Questions answered; patient denies further questions. Patient is calling family member to drive her home. Lina SarBeth Katora Fini, RN

## 2017-07-08 NOTE — Discharge Summary (Signed)
Physician Discharge Summary  Wendy Lewis ZOX:[id number] DOB: 1940/07/25 DOA: 07/03/2017  PCP: [name], MD  Admit date: 07/03/2017 Discharge date: 07/08/2017  Admitted From: Home Disposition:   Home  Recommendations for Outpatient Follow-up and new medication changes:  1. Follow up with PCP in 1- week 2. Please obtain BMP/CBC in one week 3. Please follow up on the following pending results:  Home Health: No Equipment/Devices: none   Discharge Condition: stable CODE STATUS: full  Diet recommendation:  Regular  Brief/Interim Summary: 77 year old female who presented with abdominal pain.  She does have the significant past medical history for cerebral palsy, hypertension, and chronic kidney disease.  Patient had intermittent lower abdominal pain, nonradiating, associated with nausea for the last 7 days prior to hospitalization.  Her symptoms were worsening to the point where she was not able to drink or eat.  Blood pressure 99/85, heart rate 83, respiratory rate 18, saturation 96%, lungs were clear to auscultation bilaterally, heart S1-S2 present and rhythmic, the abdomen was soft nontender, no lower extremity edema. Sodium 135, potassium 3.5, chloride 95, bicarb 28, glucose 118, BUN 14, creatinine 1.38, white count 9.2, hemoglobin 11.7, hematocrit 33.3, platelets 229.  Urinalysis 6-30 white cells, fecal occult blood was positive.  CT of the abdomen with focal wall thickening of the mid ascending colon with surrounding inflammatory changes, suspicious for inflammatory or infectious colitis.  EKG 107 bpm, multifocal atrial tachycardia.   Patient was admitted to the hospital and diagnosis acute colitis.   1.  Acute colitis, ischemic to rule out infectious.  Patient was admitted to the medical ward, she received IV fluids and IV analgesics, no significant blood loss, C. difficile was negative, GI  panel was negative.  Patient was seen by GI, recommendations to continue antibiotics for a  total of 10 days, and follow-up as an outpatient for possible colonoscopy.  We hold aspirin for now.  2.  Hypertension.  Patient had blood pressure monitoring, HCTZ -ACE inhibitor was held due to risk of hypotension.  Blood pressure 158 systolic.  Patient will resume her antihypertensive agents at discharge.  3.  Acute kidney injury.  Prerenal renal failure, patient received IV fluids and supportive medical care, kidney function improved, discharge creatinine 1.0.  Potassium 4.1  4.  Cerebral palsy.  No further complications.  5.  Chronic anemia.  Likely multifactorial, her hemoglobin and hematocrit remained stable.  No packed red blood cells were transfused.   6.  Cough, patient rule out for pneumonia.     Discharge Diagnoses:  Active Problems:   CP (cerebral palsy) (HCC)   Hypertension   Colitis    Discharge Instructions   Allergies as of 07/08/2017   No Known Allergies     Medication List    STOP taking these medications   aspirin 81 MG tablet   nystatin cream Commonly known as:  MYCOSTATIN     TAKE these medications   ciprofloxacin 250 MG tablet Commonly known as:  CIPRO Take 1 tablet (250 mg total) by mouth 2 (two) times daily for 7 days.   clobetasol cream 0.05 % Commonly known as:  TEMOVATE Apply 1 application topically 2 (two) times daily.   CORICIDIN 2-325 MG Tabs Generic drug:  Chlorpheniramine-APAP Take 1 tablet by mouth as needed (cold).   lisinopril-hydrochlorothiazide 20-25 MG tablet Commonly known as:  PRINZIDE,ZESTORETIC Take 1 tablet by mouth daily.   metroNIDAZOLE 500 MG tablet Commonly known as:  FLAGYL Take 1 tablet (500 mg total) by mouth every  8 (eight) hours for 7 days.   multivitamin with minerals Tabs tablet Take 1 tablet by mouth daily.   ondansetron 4 MG disintegrating tablet Commonly known as:  ZOFRAN ODT 4mg  ODT q4 hours prn nausea/vomit   VITAMIN D PO Take 1 tablet by mouth daily.       No Known  Allergies  Consultations: GI  Procedures/Studies: Ct Abdomen Pelvis W Contrast  Result Date: 07/03/2017 CLINICAL DATA:  Persistent nausea and vomiting. EXAM: CT ABDOMEN AND PELVIS WITH CONTRAST TECHNIQUE: Multidetector CT imaging of the abdomen and pelvis was performed using the standard protocol following bolus administration of intravenous contrast. CONTRAST:  80mL ISOVUE-300 IOPAMIDOL (ISOVUE-300) INJECTION 61%, 30mL ISOVUE-300 IOPAMIDOL (ISOVUE-300) INJECTION 61% COMPARISON:  None. FINDINGS: Lower chest: No acute abnormality. Hepatobiliary: Subcentimeter low-density lesion in the left hepatic lobe is too small to characterize. No other focal liver abnormality. Gallbladder is unremarkable. No biliary dilatation. Pancreas: Unremarkable. No pancreatic ductal dilatation or surrounding inflammatory changes. Spleen: Normal in size without focal abnormality. Adrenals/Urinary Tract: The adrenal glands are unremarkable. 1.1 cm cyst in the midpole of the left kidney. The kidneys are otherwise unremarkable. No renal or ureteral calculi. No hydronephrosis. The bladder is unremarkable. Stomach/Bowel: The stomach and small bowel are unremarkable. There is mild wall thickening and stranding surrounding the mid ascending colon. No bowel obstruction. The appendix is not identified. Vascular/Lymphatic: Aortic atherosclerosis. No enlarged abdominal or pelvic lymph nodes. Reproductive: Status post hysterectomy. The right ovary is unremarkable. There is a 1.5 cm cystic lesion in the left ovary, benign. Other: Trace free fluid in the right paracolic gutter and pelvis. No drainable fluid collection or pneumoperitoneum. Musculoskeletal: No acute or significant osseous findings. Degenerative changes of the lower lumbar spine. IMPRESSION: 1. Focal wall thickening of the mid ascending colon with surrounding inflammatory changes is suspicious for inflammatory or infectious colitis. In the non-urgent setting following complete  resolution of the patient's symptoms, colonoscopy may be warranted to exclude the unlikely possibility of an underlying neoplastic lesion. 2.  Aortic atherosclerosis (ICD10-I70.0). Electronically Signed   By: Obie DredgeWilliam T Derry M.D.   On: 07/03/2017 11:29   Dg Chest Port 1 View  Result Date: 06/29/2017 CLINICAL DATA:  Shortness of breath.  Flu-like symptoms. EXAM: PORTABLE CHEST 1 VIEW COMPARISON:  Radiographs and CT 03/01/2017 FINDINGS: The cardiomediastinal contours are unchanged with aortic tortuosity. Heart is normal in size. Pulmonary vasculature is normal. No consolidation, pleural effusion, or pneumothorax. No acute osseous abnormalities are seen. IMPRESSION: No acute abnormality. Electronically Signed   By: Rubye OaksMelanie  Ehinger M.D.   On: 06/29/2017 21:31       Subjective: Patient is feeling better, no nausea or vomiting, no dyspnea or chest pain, no cough.   Discharge Exam: Vitals:   07/08/17 0520 07/08/17 1415  BP: 136/88 (!) 158/90  Pulse:  95  Resp:  18  Temp:  98.9 F (37.2 C)  SpO2:  98%   Vitals:   07/07/17 2133 07/08/17 0502 07/08/17 0520 07/08/17 1415  BP: 138/76 (!) 145/132 136/88 (!) 158/90  Pulse: 88 (!) 101  95  Resp: 15 16  18   Temp: 98.9 F (37.2 C) 99.1 F (37.3 C)  98.9 F (37.2 C)  TempSrc: Oral Oral  Oral  SpO2: 98% 99%  98%  Weight:      Height:        General: Not in pain or dyspnea, deconditioned Neurology: Awake and alert, non focal  E ENT: no pallor, no icterus, oral mucosa moist Cardiovascular: No JVD.  S1-S2 present, rhythmic, no gallops, rubs, or murmurs. No lower extremity edema. Pulmonary: vesicular breath sounds bilaterally, adequate air movement, no wheezing, rhonchi or rales. Gastrointestinal. Abdomen flat, no organomegaly, non tender, no rebound or guarding Skin. No rashes Musculoskeletal: no joint deformities   The results of significant diagnostics from this hospitalization (including imaging, microbiology, ancillary and laboratory)  are listed below for reference.     Microbiology: Recent Results (from the past 240 hour(s))  C difficile quick scan w PCR reflex     Status: None   Collection Time: 07/03/17  1:54 PM  Result Value Ref Range Status   C Diff antigen NEGATIVE NEGATIVE Final   C Diff toxin NEGATIVE NEGATIVE Final   C Diff interpretation No C. difficile detected.  Final    Comment: Performed at [facility], [address] [place], [place] [address]  Gastrointestinal Panel by PCR , Stool     Status: None   Collection Time: 07/03/17  1:55 PM  Result Value Ref Range Status   Campylobacter species NOT DETECTED NOT DETECTED Final   Plesimonas shigelloides NOT DETECTED NOT DETECTED Final   Salmonella species NOT DETECTED NOT DETECTED Final   Yersinia enterocolitica NOT DETECTED NOT DETECTED Final   Vibrio species NOT DETECTED NOT DETECTED Final   Vibrio cholerae NOT DETECTED NOT DETECTED Final   Enteroaggregative E coli (EAEC) NOT DETECTED NOT DETECTED Final   Enteropathogenic E coli (EPEC) NOT DETECTED NOT DETECTED Final   Enterotoxigenic E coli (ETEC) NOT DETECTED NOT DETECTED Final   Shiga like toxin producing E coli (STEC) NOT DETECTED NOT DETECTED Final   Shigella/Enteroinvasive E coli (EIEC) NOT DETECTED NOT DETECTED Final   Cryptosporidium NOT DETECTED NOT DETECTED Final   Cyclospora cayetanensis NOT DETECTED NOT DETECTED Final   Entamoeba histolytica NOT DETECTED NOT DETECTED Final   Giardia lamblia NOT DETECTED NOT DETECTED Final   Adenovirus F40/41 NOT DETECTED NOT DETECTED Final   Astrovirus NOT DETECTED NOT DETECTED Final   Norovirus GI/GII NOT DETECTED NOT DETECTED Final   Rotavirus A NOT DETECTED NOT DETECTED Final   Sapovirus (I, II, IV, and V) NOT DETECTED NOT DETECTED Final    Comment: Performed at [facility], [address] [place], [place] [address]     Labs: BNP (last 3 results) No results for input(s): BNP in the last 8760 hours. Basic Metabolic  Panel: Recent Labs  Lab 07/03/17 0929 07/05/17 0443 07/06/17 0410 07/07/17 0407 07/08/17 0428  NA 135 137 139 136 138  K 3.5 3.3* 4.2 4.3 4.1  CL 95* 104 108 108 105  CO2 28 24 23 22 24   GLUCOSE 119* 108* 100* 98 102*  BUN 14 8 5* <5* <5*  CREATININE 1.38* 0.99 0.99 0.96 1.00  CALCIUM 9.2 7.9* 8.0* 7.5* 8.1*  MG  --  1.6*  --   --  1.7   Liver Function Tests: Recent Labs  Lab 07/03/17 0929  AST 70*  ALT 47  ALKPHOS 88  BILITOT 0.7  PROT 7.1  ALBUMIN 3.7   Recent Labs  Lab 07/03/17 0929  LIPASE 24   No results for input(s): AMMONIA in the last 168 hours. CBC: Recent Labs  Lab 07/03/17 0929  07/05/17 1550 07/06/17 0409 07/06/17 1549 07/07/17 0407 07/08/17 0428  WBC 9.2   < > 9.1 6.9 7.8 8.1 7.9  NEUTROABS 7.4  --   --   --   --   --   --   HGB 11.7*   < >  9.1* 8.6* 9.1* 8.4* 9.0*  HCT 33.3*   < > 26.1* 24.8* 27.4* 25.0* 26.3*  MCV 88.6   < > 89.7 90.5 90.4 90.6 91.6  PLT 229   < > 241 224 258 263 346   < > = values in this interval not displayed.   Cardiac Enzymes: No results for input(s): CKTOTAL, CKMB, CKMBINDEX, TROPONINI in the last 168 hours. BNP: Invalid input(s): POCBNP CBG: No results for input(s): GLUCAP in the last 168 hours. D-Dimer No results for input(s): DDIMER in the last 72 hours. Hgb A1c No results for input(s): HGBA1C in the last 72 hours. Lipid Profile No results for input(s): CHOL, HDL, LDLCALC, TRIG, CHOLHDL, LDLDIRECT in the last 72 hours. Thyroid function studies No results for input(s): TSH, T4TOTAL, T3FREE, THYROIDAB in the last 72 hours.  Invalid input(s): FREET3 Anemia work up No results for input(s): VITAMINB12, FOLATE, FERRITIN, TIBC, IRON, RETICCTPCT in the last 72 hours. Urinalysis    Component Value Date/Time   COLORURINE YELLOW 07/03/2017 1145   APPEARANCEUR CLEAR 07/03/2017 1145   LABSPEC 1.020 07/03/2017 1145   PHURINE 5.0 07/03/2017 1145   GLUCOSEU NEGATIVE 07/03/2017 1145   HGBUR NEGATIVE 07/03/2017 1145    BILIRUBINUR NEGATIVE 07/03/2017 1145   KETONESUR 5 (A) 07/03/2017 1145   PROTEINUR NEGATIVE 07/03/2017 1145   NITRITE NEGATIVE 07/03/2017 1145   LEUKOCYTESUR MODERATE (A) 07/03/2017 1145   Sepsis Labs Invalid input(s): PROCALCITONIN,  WBC,  LACTICIDVEN Microbiology Recent Results (from the past 240 hour(s))  C difficile quick scan w PCR reflex     Status: None   Collection Time: 07/03/17  1:54 PM  Result Value Ref Range Status   C Diff antigen NEGATIVE NEGATIVE Final   C Diff toxin NEGATIVE NEGATIVE Final   C Diff interpretation No C. difficile detected.  Final    Comment: Performed at [facility], [address] [place], [place] [address]  Gastrointestinal Panel by PCR , Stool     Status: None   Collection Time: 07/03/17  1:55 PM  Result Value Ref Range Status   Campylobacter species NOT DETECTED NOT DETECTED Final   Plesimonas shigelloides NOT DETECTED NOT DETECTED Final   Salmonella species NOT DETECTED NOT DETECTED Final   Yersinia enterocolitica NOT DETECTED NOT DETECTED Final   Vibrio species NOT DETECTED NOT DETECTED Final   Vibrio cholerae NOT DETECTED NOT DETECTED Final   Enteroaggregative E coli (EAEC) NOT DETECTED NOT DETECTED Final   Enteropathogenic E coli (EPEC) NOT DETECTED NOT DETECTED Final   Enterotoxigenic E coli (ETEC) NOT DETECTED NOT DETECTED Final   Shiga like toxin producing E coli (STEC) NOT DETECTED NOT DETECTED Final   Shigella/Enteroinvasive E coli (EIEC) NOT DETECTED NOT DETECTED Final   Cryptosporidium NOT DETECTED NOT DETECTED Final   Cyclospora cayetanensis NOT DETECTED NOT DETECTED Final   Entamoeba histolytica NOT DETECTED NOT DETECTED Final   Giardia lamblia NOT DETECTED NOT DETECTED Final   Adenovirus F40/41 NOT DETECTED NOT DETECTED Final   Astrovirus NOT DETECTED NOT DETECTED Final   Norovirus GI/GII NOT DETECTED NOT DETECTED Final   Rotavirus A NOT DETECTED NOT DETECTED Final   Sapovirus (I, II, IV, and V) NOT  DETECTED NOT DETECTED Final    Comment: Performed at [facility], [address] [place], [place] [address]     Time coordinating discharge: 45 minutes  SIGNED:   Coralie Keens, MD  [facility] 07/08/2017, 3:22 PM Pager [phone]  If 7PM-7AM, please contact night-coverage [email or web address]  Password [email or web address]

## 2017-10-24 ENCOUNTER — Encounter: Payer: Self-pay | Admitting: Gynecology

## 2017-10-24 ENCOUNTER — Ambulatory Visit (INDEPENDENT_AMBULATORY_CARE_PROVIDER_SITE_OTHER): Payer: Medicare Other

## 2017-10-24 ENCOUNTER — Other Ambulatory Visit: Payer: Self-pay | Admitting: Gynecology

## 2017-10-24 DIAGNOSIS — M8589 Other specified disorders of bone density and structure, multiple sites: Secondary | ICD-10-CM

## 2017-10-24 DIAGNOSIS — M8580 Other specified disorders of bone density and structure, unspecified site: Secondary | ICD-10-CM

## 2017-10-24 DIAGNOSIS — Z780 Asymptomatic menopausal state: Secondary | ICD-10-CM

## 2018-06-05 ENCOUNTER — Ambulatory Visit: Payer: Medicare Other | Admitting: Gynecology

## 2018-06-05 ENCOUNTER — Encounter: Payer: Self-pay | Admitting: Gynecology

## 2018-06-05 VITALS — BP 122/74 | Ht 60.0 in | Wt 102.0 lb

## 2018-06-05 DIAGNOSIS — N952 Postmenopausal atrophic vaginitis: Secondary | ICD-10-CM | POA: Diagnosis not present

## 2018-06-05 DIAGNOSIS — L900 Lichen sclerosus et atrophicus: Secondary | ICD-10-CM

## 2018-06-05 DIAGNOSIS — M8580 Other specified disorders of bone density and structure, unspecified site: Secondary | ICD-10-CM | POA: Diagnosis not present

## 2018-06-05 DIAGNOSIS — Z01419 Encounter for gynecological examination (general) (routine) without abnormal findings: Secondary | ICD-10-CM | POA: Diagnosis not present

## 2018-06-05 NOTE — Progress Notes (Signed)
    LEILIANA GONSER [birth year] [id number]        78 y.o.  G2P2002 for annual gynecologic exam.  Without gynecologic complaints  Past medical history,surgical history, problem list, medications, allergies, family history and social history were all reviewed and documented as reviewed in the EPIC chart.  ROS:  Performed with pertinent positives and negatives included in the history, assessment and plan.   Additional significant findings : None   Exam: Kennon Portela assistant Vitals:   06/05/18 1459  BP: 122/74  Weight: 102 lb (46.3 kg)  Height: 5' (1.524 m)   Body mass index is 19.92 kg/m.  General appearance:  Normal affect, orientation and appearance. Skin: Grossly normal HEENT: Without gross lesions.  No cervical or supraclavicular adenopathy. Thyroid normal.  Lungs:  Clear without wheezing, rales or rhonchi Cardiac: RR, without RMG Abdominal:  Soft, nontender, without masses, guarding, rebound, organomegaly or hernia Breasts:  Without masses, retractions, discharge or axillary adenopathy. Pelvic:  Ext, BUS, Vagina: With atrophic changes  Adnexa: Without masses or tenderness    Anus and perineum: Normal   Rectovaginal: Normal sphincter tone without palpated masses or tenderness.    Assessment/Plan:  78 y.o. G6P2002 female for annual gynecologic exam.   1. Postmenopausal.  Status post TAH for dysplasia a number of years ago.  No significant menopausal symptoms. 2. Lichen sclerosus.  Uses Temovate 0.05% cream occasionally.  Has supply but will call when needs more. 3. Mammography 02/2018.  Continue with annual mammography when due.  Breast exam normal today. 4. Osteopenia.  DEXA 2019 T score -1.5 FRAX 9.6% / 2.1%.  Stable from prior DEXA.  Plan repeat DEXA at 2-year interval. 5. Colonoscopy 2019.  Repeat at their recommended interval. 6. Pap smear 2018.  No Pap smear done today.  History of dysplasia for hysterectomy a number of years ago.  Options to stop screening versus  less frequent screening intervals reviewed.  Will readdress on an annual basis. 7. Health maintenance.  No routine lab work done as patient does this elsewhere.  Follow-up 1 year, sooner as needed.   [name] MD, 3:28 PM 06/05/2018

## 2018-06-05 NOTE — Patient Instructions (Signed)
Follow-up in 1 year for annual exam, sooner if any issues. 

## 2019-02-22 ENCOUNTER — Encounter: Payer: Self-pay | Admitting: Gynecology

## 2019-06-12 ENCOUNTER — Other Ambulatory Visit: Payer: Self-pay

## 2019-06-13 ENCOUNTER — Ambulatory Visit (INDEPENDENT_AMBULATORY_CARE_PROVIDER_SITE_OTHER): Payer: Medicare Other | Admitting: Obstetrics and Gynecology

## 2019-06-13 ENCOUNTER — Encounter: Payer: Self-pay | Admitting: Obstetrics and Gynecology

## 2019-06-13 VITALS — BP 124/82 | Ht 59.0 in | Wt 102.0 lb

## 2019-06-13 DIAGNOSIS — Z01419 Encounter for gynecological examination (general) (routine) without abnormal findings: Secondary | ICD-10-CM | POA: Diagnosis not present

## 2019-06-13 DIAGNOSIS — L900 Lichen sclerosus et atrophicus: Secondary | ICD-10-CM

## 2019-06-13 DIAGNOSIS — M8580 Other specified disorders of bone density and structure, unspecified site: Secondary | ICD-10-CM | POA: Diagnosis not present

## 2019-06-13 NOTE — Patient Instructions (Signed)
It was nice to meet you today! Please remember to schedule your annual mammogram this year. We will plan to repeat the DEXA scan this year.  Continue weight bearing exercise, and vitamin D/calcium intake.

## 2019-06-13 NOTE — Progress Notes (Signed)
   Wendy Lewis Mar 25, 1941 [id number]  SUBJECTIVE:  79 y.o. G2P2002 female for annual routine gynecologic exam. She has no gynecologic concerns.  Current Outpatient Medications  Medication Sig Dispense Refill  . Cholecalciferol (VITAMIN D PO) Take 1 tablet by mouth daily.     . clobetasol cream (TEMOVATE) 0.05 % Apply 1 application topically 2 (two) times daily. 30 g 0  . lisinopril-hydrochlorothiazide (ZESTORETIC) 10-12.5 MG tablet Take 1 tablet by mouth daily.    . Multiple Vitamin (MULTIVITAMIN WITH MINERALS) TABS tablet Take 1 tablet by mouth daily.     No current facility-administered medications for this visit.   Allergies: Patient has no known allergies.  No LMP recorded. Patient has had a hysterectomy.  Past medical history,surgical history, problem list, medications, allergies, family history and social history were all reviewed and documented as reviewed in the EPIC chart.  ROS:  Feeling well. No dyspnea or chest pain on exertion.  No abdominal pain, change in bowel habits, black or bloody stools.  No urinary tract symptoms. GYN ROS:  no abnormal bleeding, pelvic pain or discharge, no breast pain or new or enlarging lumps on self exam. No neurological complaints.    OBJECTIVE:  BP 124/82   Ht 4\' 11"  (1.499 m)   Wt 102 lb (46.3 kg)   BMI 20.60 kg/m  The patient appears well, alert, oriented x 3, in no distress. ENT normal.  Neck supple. No cervical or supraclavicular adenopathy or thyromegaly.  Lungs are clear, good air entry, no wheezes, rhonchi or rales. S1 and S2 normal, no murmurs, regular rate and rhythm.  Abdomen soft without tenderness, guarding, mass or organomegaly.  Neurological is normal, no focal findings.  BREAST EXAM: breasts appear normal, no suspicious masses, no skin or nipple changes or axillary nodes  PELVIC EXAM: VULVA: normal appearing vulva with no masses, tenderness or lesions, +erythema and 'parchment paper changes' consistent with lichen  sclerosis, atrophy noted.  VAGINA: normal appearing vagina with normal color and discharge, no lesions, atrophic changes.  CERVIX: surgically absent, UTERUS: surgically absent, vaginal cuff well healed, ADNEXA: no masses, non tender RECTAL: normal rectal, no masses  Chaperone: present during the examination  ASSESSMENT:  79 y.o. 63 here for annual gynecologic exam  PLAN:   1. Postmenopausal.  Prior TAH for cervical dysplasia several years ago.  No gynecologic concerns today. 2. Lichen sclerosis. She uses clobetasol 0.05% cream seldomly and has no concerns at this time.  3. Pap smear 2018. Not repeated today. History of hysterectomy for dysplasia several years ago. She is comfortable with not further screening based on age criteria and several normal Pap test results since the hysterectomy. 4. Mammogram 03/2019. Will continue with annual mammography. Breast exam normal today. 5. Colonoscopy 2019. Recommended that she continue per the prescribed interval.   6. Osteopenia.  Plan to repeat DEXA this year.  DEXA 2019 T score -1.5 FRAX 9.6% / 2.1%.  Stable from prior DEXA.  She will plan to schedule when she gets the reminder.  7. Health maintenance.  No lab work as she has this completed with her primary care provider.  Return annually or sooner, prn.  2020 MD  06/13/19

## 2019-06-14 NOTE — Addendum Note (Signed)
Addended by: Dayna Barker on: 06/14/2019 02:14 PM   Modules accepted: Orders

## 2019-06-25 ENCOUNTER — Ambulatory Visit: Payer: Medicare Other | Attending: Internal Medicine

## 2019-06-25 DIAGNOSIS — Z23 Encounter for immunization: Secondary | ICD-10-CM | POA: Insufficient documentation

## 2019-06-25 NOTE — Progress Notes (Signed)
   Covid-19 Vaccination Clinic  Name:  Wendy Lewis    MRN: [id number] DOB: 1940-09-06  06/25/2019  [name] was observed post Covid-19 immunization for 15 minutes without incidence. She was provided with Vaccine Information Sheet and instruction to access the V-Safe system.   [name] was instructed to call 911 with any severe reactions post vaccine: Marland Kitchen Difficulty breathing  . Swelling of your face and throat  . A fast heartbeat  . A bad rash all over your body  . Dizziness and weakness    Immunizations Administered    Name Date Dose VIS Date Route   Pfizer COVID-19 Vaccine 06/25/2019  9:58 AM 0.3 mL 04/27/2019 Intramuscular   Manufacturer: [employer], [employer]   Lot: [id number]   NDC: [id number]

## 2019-07-12 ENCOUNTER — Ambulatory Visit: Payer: Medicare Other

## 2019-07-19 ENCOUNTER — Ambulatory Visit: Payer: Medicare Other | Attending: Internal Medicine

## 2019-07-19 DIAGNOSIS — Z23 Encounter for immunization: Secondary | ICD-10-CM | POA: Insufficient documentation

## 2019-07-19 NOTE — Progress Notes (Signed)
   Covid-19 Vaccination Clinic  Name:  Wendy Lewis    MRN: [id number] DOB: 02-11-1941  07/19/2019  [name] was observed post Covid-19 immunization for 15 minutes without incident. She was provided with Vaccine Information Sheet and instruction to access the V-Safe system.   [name] was instructed to call 911 with any severe reactions post vaccine: Marland Kitchen Difficulty breathing  . Swelling of face and throat  . A fast heartbeat  . A bad rash all over body  . Dizziness and weakness   Immunizations Administered    Name Date Dose VIS Date Route   Pfizer COVID-19 Vaccine 07/19/2019  4:15 PM 0.3 mL 04/27/2019 Intramuscular   Manufacturer: [employer], [employer]   Lot: [id number]   NDC: [address]-5

## 2019-10-29 ENCOUNTER — Other Ambulatory Visit: Payer: Self-pay

## 2019-10-30 ENCOUNTER — Ambulatory Visit (INDEPENDENT_AMBULATORY_CARE_PROVIDER_SITE_OTHER): Payer: Medicare Other

## 2019-10-30 ENCOUNTER — Other Ambulatory Visit: Payer: Self-pay | Admitting: Obstetrics and Gynecology

## 2019-10-30 DIAGNOSIS — Z780 Asymptomatic menopausal state: Secondary | ICD-10-CM | POA: Diagnosis not present

## 2019-10-30 DIAGNOSIS — M8580 Other specified disorders of bone density and structure, unspecified site: Secondary | ICD-10-CM

## 2019-10-30 DIAGNOSIS — M8589 Other specified disorders of bone density and structure, multiple sites: Secondary | ICD-10-CM

## 2020-02-18 ENCOUNTER — Other Ambulatory Visit: Payer: Self-pay

## 2020-02-18 ENCOUNTER — Inpatient Hospital Stay (HOSPITAL_COMMUNITY): Payer: Medicare Other

## 2020-02-18 ENCOUNTER — Encounter (HOSPITAL_COMMUNITY)
Admission: EM | Disposition: A | Discharge status: Home Health | Payer: Self-pay | Source: Home / Self Care | Attending: Student

## 2020-02-18 ENCOUNTER — Emergency Department (HOSPITAL_COMMUNITY): Payer: Medicare Other

## 2020-02-18 ENCOUNTER — Inpatient Hospital Stay (HOSPITAL_COMMUNITY)
Admission: EM | Admit: 2020-02-18 | Discharge: 2020-02-21 | DRG: 522 | Disposition: A | Discharge status: Home Health | Payer: Medicare Other | Attending: Internal Medicine | Admitting: Internal Medicine

## 2020-02-18 ENCOUNTER — Inpatient Hospital Stay (HOSPITAL_COMMUNITY): Payer: Medicare Other | Admitting: Anesthesiology

## 2020-02-18 ENCOUNTER — Encounter (HOSPITAL_COMMUNITY): Payer: Self-pay | Admitting: Emergency Medicine

## 2020-02-18 DIAGNOSIS — G809 Cerebral palsy, unspecified: Secondary | ICD-10-CM | POA: Diagnosis present

## 2020-02-18 DIAGNOSIS — T464X5A Adverse effect of angiotensin-converting-enzyme inhibitors, initial encounter: Secondary | ICD-10-CM | POA: Diagnosis not present

## 2020-02-18 DIAGNOSIS — D62 Acute posthemorrhagic anemia: Secondary | ICD-10-CM | POA: Diagnosis not present

## 2020-02-18 DIAGNOSIS — D696 Thrombocytopenia, unspecified: Secondary | ICD-10-CM | POA: Diagnosis present

## 2020-02-18 DIAGNOSIS — D649 Anemia, unspecified: Secondary | ICD-10-CM | POA: Diagnosis present

## 2020-02-18 DIAGNOSIS — D638 Anemia in other chronic diseases classified elsewhere: Secondary | ICD-10-CM | POA: Diagnosis present

## 2020-02-18 DIAGNOSIS — Z885 Allergy status to narcotic agent status: Secondary | ICD-10-CM | POA: Diagnosis not present

## 2020-02-18 DIAGNOSIS — Z79899 Other long term (current) drug therapy: Secondary | ICD-10-CM | POA: Diagnosis not present

## 2020-02-18 DIAGNOSIS — Z8249 Family history of ischemic heart disease and other diseases of the circulatory system: Secondary | ICD-10-CM | POA: Diagnosis not present

## 2020-02-18 DIAGNOSIS — Z90710 Acquired absence of both cervix and uterus: Secondary | ICD-10-CM | POA: Diagnosis not present

## 2020-02-18 DIAGNOSIS — Z20822 Contact with and (suspected) exposure to covid-19: Secondary | ICD-10-CM | POA: Diagnosis present

## 2020-02-18 DIAGNOSIS — K5900 Constipation, unspecified: Secondary | ICD-10-CM | POA: Diagnosis not present

## 2020-02-18 DIAGNOSIS — I10 Essential (primary) hypertension: Secondary | ICD-10-CM | POA: Diagnosis present

## 2020-02-18 DIAGNOSIS — N179 Acute kidney failure, unspecified: Secondary | ICD-10-CM | POA: Diagnosis present

## 2020-02-18 DIAGNOSIS — Z87410 Personal history of cervical dysplasia: Secondary | ICD-10-CM

## 2020-02-18 DIAGNOSIS — E871 Hypo-osmolality and hyponatremia: Secondary | ICD-10-CM | POA: Diagnosis present

## 2020-02-18 DIAGNOSIS — Z87891 Personal history of nicotine dependence: Secondary | ICD-10-CM

## 2020-02-18 DIAGNOSIS — S72032A Displaced midcervical fracture of left femur, initial encounter for closed fracture: Secondary | ICD-10-CM | POA: Diagnosis present

## 2020-02-18 DIAGNOSIS — Z833 Family history of diabetes mellitus: Secondary | ICD-10-CM | POA: Diagnosis not present

## 2020-02-18 DIAGNOSIS — D72829 Elevated white blood cell count, unspecified: Secondary | ICD-10-CM | POA: Diagnosis present

## 2020-02-18 DIAGNOSIS — Z419 Encounter for procedure for purposes other than remedying health state, unspecified: Secondary | ICD-10-CM

## 2020-02-18 DIAGNOSIS — Z96649 Presence of unspecified artificial hip joint: Secondary | ICD-10-CM

## 2020-02-18 DIAGNOSIS — S72002A Fracture of unspecified part of neck of left femur, initial encounter for closed fracture: Secondary | ICD-10-CM | POA: Diagnosis present

## 2020-02-18 DIAGNOSIS — Z96642 Presence of left artificial hip joint: Secondary | ICD-10-CM | POA: Diagnosis not present

## 2020-02-18 DIAGNOSIS — W1830XA Fall on same level, unspecified, initial encounter: Secondary | ICD-10-CM | POA: Diagnosis present

## 2020-02-18 DIAGNOSIS — E876 Hypokalemia: Secondary | ICD-10-CM | POA: Diagnosis not present

## 2020-02-18 HISTORY — PX: TOTAL HIP ARTHROPLASTY: SHX124

## 2020-02-18 LAB — CBC WITH DIFFERENTIAL/PLATELET
Abs Immature Granulocytes: 0.08 10*3/uL — ABNORMAL HIGH (ref 0.00–0.07)
Basophils Absolute: 0 10*3/uL (ref 0.0–0.1)
Basophils Relative: 0 %
Eosinophils Absolute: 0.1 10*3/uL (ref 0.0–0.5)
Eosinophils Relative: 1 %
HCT: 33.5 % — ABNORMAL LOW (ref 36.0–46.0)
Hemoglobin: 11.6 g/dL — ABNORMAL LOW (ref 12.0–15.0)
Immature Granulocytes: 1 %
Lymphocytes Relative: 14 %
Lymphs Abs: 1.8 10*3/uL (ref 0.7–4.0)
MCH: 31.8 pg (ref 26.0–34.0)
MCHC: 34.6 g/dL (ref 30.0–36.0)
MCV: 91.8 fL (ref 80.0–100.0)
Monocytes Absolute: 0.9 10*3/uL (ref 0.1–1.0)
Monocytes Relative: 7 %
Neutro Abs: 9.6 10*3/uL — ABNORMAL HIGH (ref 1.7–7.7)
Neutrophils Relative %: 77 %
Platelets: 209 10*3/uL (ref 150–400)
RBC: 3.65 MIL/uL — ABNORMAL LOW (ref 3.87–5.11)
RDW: 12.5 % (ref 11.5–15.5)
WBC: 12.5 10*3/uL — ABNORMAL HIGH (ref 4.0–10.5)
nRBC: 0 % (ref 0.0–0.2)

## 2020-02-18 LAB — RENAL FUNCTION PANEL
Albumin: 4 g/dL (ref 3.5–5.0)
Anion gap: 9 (ref 5–15)
BUN: 15 mg/dL (ref 8–23)
CO2: 27 mmol/L (ref 22–32)
Calcium: 8.8 mg/dL — ABNORMAL LOW (ref 8.9–10.3)
Chloride: 97 mmol/L — ABNORMAL LOW (ref 98–111)
Creatinine, Ser: 1.16 mg/dL — ABNORMAL HIGH (ref 0.44–1.00)
GFR calc Af Amer: 52 mL/min — ABNORMAL LOW (ref >60)
GFR calc non Af Amer: 45 mL/min — ABNORMAL LOW (ref >60)
Glucose, Bld: 118 mg/dL — ABNORMAL HIGH (ref 70–99)
Phosphorus: 3 mg/dL (ref 2.5–4.6)
Potassium: 4.7 mmol/L (ref 3.5–5.1)
Sodium: 133 mmol/L — ABNORMAL LOW (ref 135–145)

## 2020-02-18 LAB — COMPREHENSIVE METABOLIC PANEL
ALT: 19 U/L (ref 0–44)
AST: 32 U/L (ref 15–41)
Albumin: 4 g/dL (ref 3.5–5.0)
Alkaline Phosphatase: 65 U/L (ref 38–126)
Anion gap: 11 (ref 5–15)
BUN: 16 mg/dL (ref 8–23)
CO2: 25 mmol/L (ref 22–32)
Calcium: 8.8 mg/dL — ABNORMAL LOW (ref 8.9–10.3)
Chloride: 97 mmol/L — ABNORMAL LOW (ref 98–111)
Creatinine, Ser: 1.19 mg/dL — ABNORMAL HIGH (ref 0.44–1.00)
GFR calc Af Amer: 51 mL/min — ABNORMAL LOW (ref >60)
GFR calc non Af Amer: 44 mL/min — ABNORMAL LOW (ref >60)
Glucose, Bld: 158 mg/dL — ABNORMAL HIGH (ref 70–99)
Potassium: 3.4 mmol/L — ABNORMAL LOW (ref 3.5–5.1)
Sodium: 133 mmol/L — ABNORMAL LOW (ref 135–145)
Total Bilirubin: 0.6 mg/dL (ref 0.3–1.2)
Total Protein: 6.6 g/dL (ref 6.5–8.1)

## 2020-02-18 LAB — HEMOGLOBIN A1C
Hgb A1c MFr Bld: 5.9 % — ABNORMAL HIGH (ref 4.8–5.6)
Mean Plasma Glucose: 122.63 mg/dL

## 2020-02-18 LAB — RESPIRATORY PANEL BY RT PCR (FLU A&B, COVID)
Influenza A by PCR: NEGATIVE
Influenza B by PCR: NEGATIVE
SARS Coronavirus 2 by RT PCR: NEGATIVE

## 2020-02-18 LAB — TYPE AND SCREEN
ABO/RH(D): O POS
Antibody Screen: NEGATIVE

## 2020-02-18 LAB — PROTIME-INR
INR: 1 (ref 0.8–1.2)
Prothrombin Time: 12.6 seconds (ref 11.4–15.2)

## 2020-02-18 LAB — CK: Total CK: 298 U/L — ABNORMAL HIGH (ref 38–234)

## 2020-02-18 LAB — ABO/RH: ABO/RH(D): O POS

## 2020-02-18 LAB — HEMOGLOBIN AND HEMATOCRIT, BLOOD
HCT: 34.3 % — ABNORMAL LOW (ref 36.0–46.0)
Hemoglobin: 11.8 g/dL — ABNORMAL LOW (ref 12.0–15.0)

## 2020-02-18 LAB — MAGNESIUM: Magnesium: 2 mg/dL (ref 1.7–2.4)

## 2020-02-18 SURGERY — ARTHROPLASTY, HIP, TOTAL, ANTERIOR APPROACH
Anesthesia: General | Site: Hip | Laterality: Left

## 2020-02-18 MED ORDER — PHENYLEPHRINE HCL (PRESSORS) 10 MG/ML IV SOLN
INTRAVENOUS | Status: AC
  Filled 2020-02-18: qty 1

## 2020-02-18 MED ORDER — MORPHINE SULFATE (PF) 2 MG/ML IV SOLN: 0.5000 mg | INTRAVENOUS | Status: DC | PRN

## 2020-02-18 MED ORDER — HYDRALAZINE HCL 20 MG/ML IJ SOLN: 10.0000 mg | INTRAMUSCULAR | Status: DC | PRN

## 2020-02-18 MED ORDER — TRANEXAMIC ACID-NACL 1000-0.7 MG/100ML-% IV SOLN
1000.0000 mg | Freq: Once | INTRAVENOUS | Status: AC
  Administered 2020-02-18: 1000 mg via INTRAVENOUS
  Filled 2020-02-18: qty 100

## 2020-02-18 MED ORDER — ACETAMINOPHEN 10 MG/ML IV SOLN: 1000.0000 mg | Freq: Once | INTRAVENOUS | Status: DC | PRN

## 2020-02-18 MED ORDER — SODIUM CHLORIDE 0.9 % IR SOLN
Status: DC | PRN
  Administered 2020-02-18: 1000 mL

## 2020-02-18 MED ORDER — PROPOFOL 10 MG/ML IV BOLUS
INTRAVENOUS | Status: AC
  Filled 2020-02-18: qty 20

## 2020-02-18 MED ORDER — FENTANYL CITRATE (PF) 100 MCG/2ML IJ SOLN
INTRAMUSCULAR | Status: AC
  Administered 2020-02-18: 25 ug via INTRAVENOUS
  Filled 2020-02-18: qty 2

## 2020-02-18 MED ORDER — STERILE WATER FOR IRRIGATION IR SOLN
Status: DC | PRN
  Administered 2020-02-18: 2000 mL

## 2020-02-18 MED ORDER — ONDANSETRON HCL 4 MG/2ML IJ SOLN
4.0000 mg | Freq: Four times a day (QID) | INTRAMUSCULAR | Status: DC | PRN
  Administered 2020-02-18 – 2020-02-19 (×2): 4 mg via INTRAVENOUS
  Filled 2020-02-18 (×2): qty 2

## 2020-02-18 MED ORDER — FENTANYL CITRATE (PF) 100 MCG/2ML IJ SOLN
INTRAMUSCULAR | Status: AC
  Filled 2020-02-18: qty 2

## 2020-02-18 MED ORDER — SUGAMMADEX SODIUM 200 MG/2ML IV SOLN
INTRAVENOUS | Status: DC | PRN
  Administered 2020-02-18: 185.2 mg via INTRAVENOUS

## 2020-02-18 MED ORDER — EPHEDRINE 5 MG/ML INJ
INTRAVENOUS | Status: AC
  Filled 2020-02-18: qty 10

## 2020-02-18 MED ORDER — PROMETHAZINE HCL 25 MG/ML IJ SOLN
12.5000 mg | Freq: Once | INTRAMUSCULAR | Status: AC
  Administered 2020-02-18: 12.5 mg via INTRAVENOUS
  Filled 2020-02-18: qty 1

## 2020-02-18 MED ORDER — MORPHINE SULFATE (PF) 2 MG/ML IV SOLN
2.0000 mg | INTRAVENOUS | Status: DC | PRN
  Administered 2020-02-18 (×2): 2 mg via INTRAVENOUS
  Filled 2020-02-18 (×2): qty 1

## 2020-02-18 MED ORDER — MORPHINE SULFATE (PF) 2 MG/ML IV SOLN
0.5000 mg | INTRAVENOUS | Status: DC | PRN
  Administered 2020-02-18: 0.5 mg via INTRAVENOUS
  Filled 2020-02-18: qty 1

## 2020-02-18 MED ORDER — HYDROCODONE-ACETAMINOPHEN 5-325 MG PO TABS
1.0000 | ORAL_TABLET | ORAL | Status: DC | PRN
  Administered 2020-02-19 – 2020-02-20 (×3): 1 via ORAL
  Filled 2020-02-18 (×3): qty 1

## 2020-02-18 MED ORDER — ACETAMINOPHEN 325 MG PO TABS
325.0000 mg | ORAL_TABLET | Freq: Four times a day (QID) | ORAL | Status: DC | PRN
  Administered 2020-02-21 (×2): 650 mg via ORAL
  Filled 2020-02-18 (×2): qty 2

## 2020-02-18 MED ORDER — ACETAMINOPHEN 10 MG/ML IV SOLN
INTRAVENOUS | Status: AC
  Administered 2020-02-18: 1000 mg via INTRAVENOUS
  Filled 2020-02-18: qty 100

## 2020-02-18 MED ORDER — FERROUS SULFATE 325 (65 FE) MG PO TABS
325.0000 mg | ORAL_TABLET | Freq: Three times a day (TID) | ORAL | Status: DC
  Administered 2020-02-18 – 2020-02-21 (×9): 325 mg via ORAL
  Filled 2020-02-18 (×9): qty 1

## 2020-02-18 MED ORDER — METOCLOPRAMIDE HCL 5 MG PO TABS: 5.0000 mg | ORAL_TABLET | Freq: Three times a day (TID) | ORAL | Status: DC | PRN

## 2020-02-18 MED ORDER — LACTATED RINGERS IV SOLN: INTRAVENOUS | Status: DC

## 2020-02-18 MED ORDER — ENSURE ENLIVE PO LIQD
237.0000 mL | Freq: Two times a day (BID) | ORAL | Status: DC
  Administered 2020-02-19 – 2020-02-21 (×6): 237 mL via ORAL
  Filled 2020-02-18 (×2): qty 237

## 2020-02-18 MED ORDER — BISACODYL 10 MG RE SUPP
10.0000 mg | Freq: Every day | RECTAL | Status: DC | PRN
  Filled 2020-02-18: qty 1

## 2020-02-18 MED ORDER — ROCURONIUM BROMIDE 10 MG/ML (PF) SYRINGE
PREFILLED_SYRINGE | INTRAVENOUS | Status: DC | PRN
  Administered 2020-02-18: 70 mg via INTRAVENOUS

## 2020-02-18 MED ORDER — DEXAMETHASONE SODIUM PHOSPHATE 10 MG/ML IJ SOLN
10.0000 mg | Freq: Once | INTRAMUSCULAR | Status: AC
  Administered 2020-02-19: 10 mg via INTRAVENOUS
  Filled 2020-02-18: qty 1

## 2020-02-18 MED ORDER — POVIDONE-IODINE 10 % EX SWAB
2.0000 "application " | Freq: Once | CUTANEOUS | Status: AC
  Administered 2020-02-18: 2 via TOPICAL

## 2020-02-18 MED ORDER — PROPOFOL 10 MG/ML IV BOLUS
INTRAVENOUS | Status: DC | PRN
  Administered 2020-02-18: 120 mg via INTRAVENOUS

## 2020-02-18 MED ORDER — LIDOCAINE 2% (20 MG/ML) 5 ML SYRINGE
INTRAMUSCULAR | Status: AC
  Filled 2020-02-18: qty 5

## 2020-02-18 MED ORDER — FENTANYL CITRATE (PF) 250 MCG/5ML IJ SOLN
INTRAMUSCULAR | Status: DC | PRN
Start: 2020-02-18 — End: 2020-02-18
  Administered 2020-02-18 (×2): 50 ug via INTRAVENOUS

## 2020-02-18 MED ORDER — ONDANSETRON HCL 4 MG/2ML IJ SOLN: 4.0000 mg | Freq: Once | INTRAMUSCULAR | Status: DC | PRN

## 2020-02-18 MED ORDER — DIPHENHYDRAMINE HCL 12.5 MG/5ML PO ELIX: 12.5000 mg | ORAL_SOLUTION | ORAL | Status: DC | PRN

## 2020-02-18 MED ORDER — MORPHINE SULFATE (PF) 4 MG/ML IV SOLN
4.0000 mg | Freq: Once | INTRAVENOUS | Status: AC
  Administered 2020-02-18: 4 mg via INTRAVENOUS
  Filled 2020-02-18: qty 1

## 2020-02-18 MED ORDER — ASPIRIN 81 MG PO CHEW
81.0000 mg | CHEWABLE_TABLET | Freq: Two times a day (BID) | ORAL | Status: DC
  Administered 2020-02-18 – 2020-02-21 (×6): 81 mg via ORAL
  Filled 2020-02-18 (×6): qty 1

## 2020-02-18 MED ORDER — SODIUM CHLORIDE 0.9 % IV SOLN: INTRAVENOUS | Status: DC

## 2020-02-18 MED ORDER — ROPIVACAINE HCL 5 MG/ML IJ SOLN
INTRAMUSCULAR | Status: DC | PRN
  Administered 2020-02-18 (×6): 5 mL via PERINEURAL

## 2020-02-18 MED ORDER — LIDOCAINE 2% (20 MG/ML) 5 ML SYRINGE
INTRAMUSCULAR | Status: DC | PRN
  Administered 2020-02-18: 60 mg via INTRAVENOUS

## 2020-02-18 MED ORDER — POTASSIUM CHLORIDE CRYS ER 20 MEQ PO TBCR
20.0000 meq | EXTENDED_RELEASE_TABLET | Freq: Once | ORAL | Status: AC
  Administered 2020-02-18: 20 meq via ORAL
  Filled 2020-02-18: qty 1

## 2020-02-18 MED ORDER — DEXAMETHASONE SODIUM PHOSPHATE 10 MG/ML IJ SOLN
INTRAMUSCULAR | Status: DC | PRN
  Administered 2020-02-18: 5 mg via INTRAVENOUS

## 2020-02-18 MED ORDER — CEFAZOLIN SODIUM-DEXTROSE 2-4 GM/100ML-% IV SOLN
2.0000 g | INTRAVENOUS | Status: AC
  Administered 2020-02-18: 2 g via INTRAVENOUS
  Filled 2020-02-18: qty 100

## 2020-02-18 MED ORDER — TRANEXAMIC ACID-NACL 1000-0.7 MG/100ML-% IV SOLN
INTRAVENOUS | Status: AC
  Filled 2020-02-18: qty 100

## 2020-02-18 MED ORDER — MENTHOL 3 MG MT LOZG: 1.0000 | LOZENGE | OROMUCOSAL | Status: DC | PRN

## 2020-02-18 MED ORDER — METOCLOPRAMIDE HCL 5 MG/ML IJ SOLN: 5.0000 mg | Freq: Three times a day (TID) | INTRAMUSCULAR | Status: DC | PRN

## 2020-02-18 MED ORDER — ONDANSETRON HCL 4 MG PO TABS: 4.0000 mg | ORAL_TABLET | Freq: Four times a day (QID) | ORAL | Status: DC | PRN

## 2020-02-18 MED ORDER — ONDANSETRON HCL 4 MG/2ML IJ SOLN
INTRAMUSCULAR | Status: AC
  Filled 2020-02-18: qty 2

## 2020-02-18 MED ORDER — ALUM & MAG HYDROXIDE-SIMETH 200-200-20 MG/5ML PO SUSP: 15.0000 mL | ORAL | Status: DC | PRN

## 2020-02-18 MED ORDER — PHENYLEPHRINE 40 MCG/ML (10ML) SYRINGE FOR IV PUSH (FOR BLOOD PRESSURE SUPPORT)
PREFILLED_SYRINGE | INTRAVENOUS | Status: AC
  Filled 2020-02-18: qty 10

## 2020-02-18 MED ORDER — POTASSIUM CHLORIDE IN NACL 20-0.9 MEQ/L-% IV SOLN
INTRAVENOUS | Status: DC
  Filled 2020-02-18 (×3): qty 1000

## 2020-02-18 MED ORDER — FENTANYL CITRATE (PF) 100 MCG/2ML IJ SOLN
50.0000 ug | INTRAMUSCULAR | Status: DC
  Administered 2020-02-18: 50 ug via INTRAVENOUS

## 2020-02-18 MED ORDER — PHENYLEPHRINE 40 MCG/ML (10ML) SYRINGE FOR IV PUSH (FOR BLOOD PRESSURE SUPPORT)
PREFILLED_SYRINGE | INTRAVENOUS | Status: DC | PRN
  Administered 2020-02-18: 80 ug via INTRAVENOUS
  Administered 2020-02-18 (×2): 120 ug via INTRAVENOUS
  Administered 2020-02-18: 80 ug via INTRAVENOUS

## 2020-02-18 MED ORDER — METHOCARBAMOL 1000 MG/10ML IJ SOLN: 500.0000 mg | Freq: Four times a day (QID) | INTRAVENOUS | Status: DC | PRN

## 2020-02-18 MED ORDER — TRANEXAMIC ACID-NACL 1000-0.7 MG/100ML-% IV SOLN
1000.0000 mg | INTRAVENOUS | Status: AC
  Administered 2020-02-18: 1000 mg via INTRAVENOUS

## 2020-02-18 MED ORDER — ROCURONIUM BROMIDE 10 MG/ML (PF) SYRINGE
PREFILLED_SYRINGE | INTRAVENOUS | Status: AC
  Filled 2020-02-18: qty 10

## 2020-02-18 MED ORDER — ONDANSETRON HCL 4 MG/2ML IJ SOLN
INTRAMUSCULAR | Status: DC | PRN
  Administered 2020-02-18: 4 mg via INTRAVENOUS

## 2020-02-18 MED ORDER — POLYETHYLENE GLYCOL 3350 17 G PO PACK
17.0000 g | PACK | Freq: Two times a day (BID) | ORAL | Status: DC
  Administered 2020-02-18 – 2020-02-21 (×6): 17 g via ORAL
  Filled 2020-02-18 (×6): qty 1

## 2020-02-18 MED ORDER — PHENOL 1.4 % MT LIQD: 1.0000 | OROMUCOSAL | Status: DC | PRN

## 2020-02-18 MED ORDER — ADULT MULTIVITAMIN W/MINERALS CH
1.0000 | ORAL_TABLET | Freq: Every day | ORAL | Status: DC
  Administered 2020-02-19 – 2020-02-21 (×3): 1 via ORAL
  Filled 2020-02-18 (×4): qty 1

## 2020-02-18 MED ORDER — CHLORHEXIDINE GLUCONATE 4 % EX LIQD
60.0000 mL | Freq: Once | CUTANEOUS | Status: AC
  Administered 2020-02-18: 4 via TOPICAL

## 2020-02-18 MED ORDER — CEFAZOLIN SODIUM-DEXTROSE 1-4 GM/50ML-% IV SOLN
1.0000 g | Freq: Four times a day (QID) | INTRAVENOUS | Status: AC
  Administered 2020-02-18 – 2020-02-19 (×2): 1 g via INTRAVENOUS
  Filled 2020-02-18 (×2): qty 50

## 2020-02-18 MED ORDER — MAGNESIUM CITRATE PO SOLN: 1.0000 | Freq: Once | ORAL | Status: DC | PRN

## 2020-02-18 MED ORDER — DOCUSATE SODIUM 100 MG PO CAPS
100.0000 mg | ORAL_CAPSULE | Freq: Two times a day (BID) | ORAL | Status: DC
  Administered 2020-02-18 – 2020-02-21 (×6): 100 mg via ORAL
  Filled 2020-02-18 (×6): qty 1

## 2020-02-18 MED ORDER — METHOCARBAMOL 500 MG PO TABS: 500.0000 mg | ORAL_TABLET | Freq: Four times a day (QID) | ORAL | Status: DC | PRN

## 2020-02-18 MED ORDER — ONDANSETRON HCL 4 MG/2ML IJ SOLN
4.0000 mg | Freq: Once | INTRAMUSCULAR | Status: AC
  Administered 2020-02-18: 01:00:00 4 mg via INTRAVENOUS
  Filled 2020-02-18: qty 2

## 2020-02-18 MED ORDER — CHLORHEXIDINE GLUCONATE 0.12 % MT SOLN
15.0000 mL | Freq: Once | OROMUCOSAL | Status: AC
  Administered 2020-02-18: 15 mL via OROMUCOSAL

## 2020-02-18 MED ORDER — HYDROCODONE-ACETAMINOPHEN 7.5-325 MG PO TABS
1.0000 | ORAL_TABLET | ORAL | Status: DC | PRN
  Administered 2020-02-19: 2 via ORAL
  Administered 2020-02-20 (×2): 1 via ORAL
  Filled 2020-02-18 (×2): qty 1
  Filled 2020-02-18: qty 2
  Filled 2020-02-18: qty 1

## 2020-02-18 MED ORDER — PHENYLEPHRINE HCL-NACL 10-0.9 MG/250ML-% IV SOLN
INTRAVENOUS | Status: DC | PRN
  Administered 2020-02-18: 25 ug/min via INTRAVENOUS

## 2020-02-18 MED ORDER — FENTANYL CITRATE (PF) 100 MCG/2ML IJ SOLN: 25.0000 ug | INTRAMUSCULAR | Status: DC | PRN

## 2020-02-18 SURGICAL SUPPLY — 45 items
BAG DECANTER FOR FLEXI CONT (MISCELLANEOUS) IMPLANT
BAG ZIPLOCK 12X15 (MISCELLANEOUS) IMPLANT
BLADE SAG 18X100X1.27 (BLADE) ×3 IMPLANT
BLADE SURG SZ10 CARB STEEL (BLADE) ×6 IMPLANT
COVER PERINEAL POST (MISCELLANEOUS) ×3 IMPLANT
COVER SURGICAL LIGHT HANDLE (MISCELLANEOUS) ×3 IMPLANT
COVER WAND RF STERILE (DRAPES) IMPLANT
CUP ACETBLR 48 OD SECTOR II (Hips) ×2 IMPLANT
DERMABOND ADVANCED (GAUZE/BANDAGES/DRESSINGS) ×2
DERMABOND ADVANCED .7 DNX12 (GAUZE/BANDAGES/DRESSINGS) ×1 IMPLANT
DRAPE STERI IOBAN 125X83 (DRAPES) ×3 IMPLANT
DRAPE U-SHAPE 47X51 STRL (DRAPES) ×6 IMPLANT
DRESSING AQUACEL AG SP 3.5X10 (GAUZE/BANDAGES/DRESSINGS) ×1 IMPLANT
DRSG AQUACEL AG ADV 3.5X10 (GAUZE/BANDAGES/DRESSINGS) ×2 IMPLANT
DRSG AQUACEL AG SP 3.5X10 (GAUZE/BANDAGES/DRESSINGS) ×3
DURAPREP 26ML APPLICATOR (WOUND CARE) ×3 IMPLANT
ELECT REM PT RETURN 15FT ADLT (MISCELLANEOUS) ×3 IMPLANT
ELIMINATOR HOLE APEX DEPUY (Hips) ×2 IMPLANT
GLOVE BIO SURGEON STRL SZ 6 (GLOVE) ×6 IMPLANT
GLOVE BIOGEL PI IND STRL 6.5 (GLOVE) ×1 IMPLANT
GLOVE BIOGEL PI IND STRL 7.5 (GLOVE) ×1 IMPLANT
GLOVE BIOGEL PI IND STRL 8.5 (GLOVE) ×1 IMPLANT
GLOVE BIOGEL PI INDICATOR 6.5 (GLOVE) ×2
GLOVE BIOGEL PI INDICATOR 7.5 (GLOVE) ×2
GLOVE BIOGEL PI INDICATOR 8.5 (GLOVE) ×2
GLOVE ECLIPSE 8.0 STRL XLNG CF (GLOVE) ×6 IMPLANT
GLOVE ORTHO TXT STRL SZ7.5 (GLOVE) ×6 IMPLANT
GOWN STRL REUS W/TWL LRG LVL3 (GOWN DISPOSABLE) ×6 IMPLANT
GOWN STRL REUS W/TWL XL LVL3 (GOWN DISPOSABLE) ×3 IMPLANT
HEAD FEM STD 32X+1 STRL (Hips) ×2 IMPLANT
HOLDER FOLEY CATH W/STRAP (MISCELLANEOUS) ×3 IMPLANT
KIT TURNOVER KIT A (KITS) IMPLANT
PACK ANTERIOR HIP CUSTOM (KITS) ×3 IMPLANT
PENCIL SMOKE EVACUATOR (MISCELLANEOUS) IMPLANT
PINN ALTRX NEUT ID X OD 32X48 ×2 IMPLANT
SCREW 6.5MMX25MM (Screw) ×2 IMPLANT
STEM FEM ACTIS HIGH SZ2 (Stem) ×2 IMPLANT
SUT MNCRL AB 4-0 PS2 18 (SUTURE) ×3 IMPLANT
SUT STRATAFIX 0 PDS 27 VIOLET (SUTURE) ×3
SUT VIC AB 1 CT1 36 (SUTURE) ×9 IMPLANT
SUT VIC AB 2-0 CT1 27 (SUTURE) ×6
SUT VIC AB 2-0 CT1 TAPERPNT 27 (SUTURE) ×2 IMPLANT
SUTURE STRATFX 0 PDS 27 VIOLET (SUTURE) ×1 IMPLANT
TRAY FOLEY MTR SLVR 16FR STAT (SET/KITS/TRAYS/PACK) IMPLANT
WATER STERILE IRR 1000ML POUR (IV SOLUTION) ×3 IMPLANT

## 2020-02-18 NOTE — Anesthesia Preprocedure Evaluation (Signed)
Anesthesia Evaluation  Patient identified by MRN, date of birth, ID band Patient awake    Reviewed: Allergy & Precautions, NPO status , Patient's Chart, lab work & pertinent test results  Airway Mallampati: I       Dental no notable dental hx.    Pulmonary former smoker,    Pulmonary exam normal        Cardiovascular hypertension, Pt. on medications Normal cardiovascular exam     Neuro/Psych negative psych ROS   GI/Hepatic negative GI ROS, Neg liver ROS,   Endo/Other    Renal/GU Renal InsufficiencyRenal disease  negative genitourinary   Musculoskeletal   Abdominal Normal abdominal exam  (+)   Peds  Hematology  (+) Blood dyscrasia, anemia ,   Anesthesia Other Findings   Reproductive/Obstetrics                             Anesthesia Physical Anesthesia Plan  ASA: II  Anesthesia Plan: General   Post-op Pain Management:  Regional for Post-op pain   Induction: Intravenous  PONV Risk Score and Plan: 3 and Ondansetron, Dexamethasone and Treatment may vary due to age or medical condition  Airway Management Planned: Oral ETT  Additional Equipment: None  Intra-op Plan:   Post-operative Plan: Extubation in OR  Informed Consent: I have reviewed the patients History and Physical, chart, labs and discussed the procedure including the risks, benefits and alternatives for the proposed anesthesia with the patient or authorized representative who has indicated his/her understanding and acceptance.     Dental advisory given  Plan Discussed with: CRNA  Anesthesia Plan Comments:         Anesthesia Quick Evaluation

## 2020-02-18 NOTE — H&P (View-Only) (Signed)
Reason for Consult: Left femoral neck fracture Referring Physician: ED Provider  Wendy Lewis is an 79 y.o. female.  HPI: The patient is a  79 y.o. female with history of hypertension anemia and cerebral palsy.  She had a fall at home when she was trying to avoid a frog in the kitchen.  She denies hitting her head. Her left hip hurt and she was brought to the ER.  X-rays reveal left hip fracture.  Dr. Charlann Boxer on-call orthopedic surgeon was consulted.  Patient admitted for surgery to fix the fracture. Risks, benefits and expectations were discussed with the patient.  Risks including but not limited to the risk of anesthesia, blood clots, nerve damage, blood vessel damage, failure of the prosthesis, infection and up to and including death.  Patient understand the risks, benefits and expectations and wishes to proceed with surgery.    .  Past Medical History:  Diagnosis Date  . Bursitis   . CP (cerebral palsy) (HCC)   . Hypertension   . Lichen sclerosus 05/2015   vulvar biopsy  . Osteopenia 10/2017   T score -1.5 FRAX 9.6% / 2.1%.  Stable from prior DEXA.    Past Surgical History:  Procedure Laterality Date  . ABDOMINAL HYSTERECTOMY  1975   TAH for cervical dysplasia  . CATARACT EXTRACTION     Bilateral  . TONSILLECTOMY      Family History  Problem Relation Age of Onset  . Diabetes Mother   . Hypertension Mother   . Heart disease Mother   . Cancer Sister        THYROID  . Cancer Paternal Grandmother        UTERINE OR OVARIAN?  Marland Kitchen Hypertension Daughter     Social History:  reports that she has quit smoking. She has never used smokeless tobacco. She reports that she does not drink alcohol and does not use drugs.  Allergies:  Allergies  Allergen Reactions  . Fentanyl And Related     Causes  nausea     Medications: I have reviewed the patient's current medications.  Results for orders placed or performed during the hospital encounter of 02/18/20 (from the past 48 hour(s))   Comprehensive metabolic panel     Status: Abnormal   Collection Time: 02/18/20 12:41 AM  Result Value Ref Range   Sodium 133 (L) 135 - 145 mmol/L   Potassium 3.4 (L) 3.5 - 5.1 mmol/L   Chloride 97 (L) 98 - 111 mmol/L   CO2 25 22 - 32 mmol/L   Glucose, Bld 158 (H) 70 - 99 mg/dL    Comment: Glucose reference range applies only to samples taken after fasting for at least 8 hours.   BUN 16 8 - 23 mg/dL   Creatinine, Ser 1.32 (H) 0.44 - 1.00 mg/dL   Calcium 8.8 (L) 8.9 - 10.3 mg/dL   Total Protein 6.6 6.5 - 8.1 g/dL   Albumin 4.0 3.5 - 5.0 g/dL   AST 32 15 - 41 U/L   ALT 19 0 - 44 U/L   Alkaline Phosphatase 65 38 - 126 U/L   Total Bilirubin 0.6 0.3 - 1.2 mg/dL   GFR calc non Af Amer 44 (L) >60 mL/min   GFR calc Af Amer 51 (L) >60 mL/min   Anion gap 11 5 - 15    Comment: Performed at [facility], [address] [place], [place] [address]  CBC with Differential     Status: Abnormal   Collection  Time: 02/18/20 12:41 AM  Result Value Ref Range   WBC 12.5 (H) 4.0 - 10.5 K/uL   RBC 3.65 (L) 3.87 - 5.11 MIL/uL   Hemoglobin 11.6 (L) 12.0 - 15.0 g/dL   HCT 40.933.5 (L) 36 - 46 %   MCV 91.8 80.0 - 100.0 fL   MCH 31.8 26.0 - 34.0 pg   MCHC 34.6 30.0 - 36.0 g/dL   RDW 81.112.5 91.411.5 - 78.215.5 %   Platelets 209 150 - 400 K/uL   nRBC 0.0 0.0 - 0.2 %   Neutrophils Relative % 77 %   Neutro Abs 9.6 (H) 1.7 - 7.7 K/uL   Lymphocytes Relative 14 %   Lymphs Abs 1.8 0.7 - 4.0 K/uL   Monocytes Relative 7 %   Monocytes Absolute 0.9 0 - 1 K/uL   Eosinophils Relative 1 %   Eosinophils Absolute 0.1 0 - 0 K/uL   Basophils Relative 0 %   Basophils Absolute 0.0 0 - 0 K/uL   Immature Granulocytes 1 %   Abs Immature Granulocytes 0.08 (H) 0.00 - 0.07 K/uL    Comment: Performed at [facility], [address] [place], [place] [address]  Protime-INR     Status: None   Collection Time: 02/18/20 12:41 AM  Result Value Ref Range   Prothrombin Time 12.6 11.4 - 15.2 seconds    INR 1.0 0.8 - 1.2    Comment: (NOTE) INR goal varies based on device and disease states. Performed at [facility], [address] [place], [place] [address]   Hemoglobin A1c     Status: Abnormal   Collection Time: 02/18/20 12:41 AM  Result Value Ref Range   Hgb A1c MFr Bld 5.9 (H) 4.8 - 5.6 %    Comment: (NOTE) Pre diabetes:          5.7%-6.4%  Diabetes:              >6.4%  Glycemic control for   <7.0% adults with diabetes    Mean Plasma Glucose 122.63 mg/dL    Comment: Performed at [facility], [address] [place], [place] [address]  Respiratory Panel by RT PCR (Flu A&B, Covid) - Nasopharyngeal Swab     Status: None   Collection Time: 02/18/20  1:02 AM   Specimen: Nasopharyngeal Swab  Result Value Ref Range   SARS Coronavirus 2 by RT PCR NEGATIVE NEGATIVE    Comment: (NOTE) SARS-CoV-2 target nucleic acids are NOT DETECTED.  The SARS-CoV-2 RNA is generally detectable in upper respiratoy specimens during the acute phase of infection. The lowest concentration of SARS-CoV-2 viral copies this assay can detect is 131 copies/mL. A negative result does not preclude SARS-Cov-2 infection and should not be used as the sole basis for treatment or other patient management decisions. A negative result may occur with  improper specimen collection/handling, submission of specimen other than nasopharyngeal swab, presence of viral mutation(s) within the areas targeted by this assay, and inadequate number of viral copies (<131 copies/mL). A negative result must be combined with clinical observations, patient history, and epidemiological information. The expected result is Negative.  Fact Sheet for Patients:  https://www.moore.com/https://www.fda.gov/media/142436/download  Fact Sheet for Healthcare Providers:  https://www.young.biz/https://www.fda.gov/media/142435/download  This test is no t yet approved or cleared by the [place] FDA and  has been authorized for detection and/or diagnosis of  SARS-CoV-2 by FDA under an Emergency Use Authorization (EUA). This EUA will remain  in effect (meaning this test can be  used) for the duration of the COVID-19 declaration under Section 564(b)(1) of the Act, 21 U.S.C. section 360bbb-3(b)(1), unless the authorization is terminated or revoked sooner.     Influenza A by PCR NEGATIVE NEGATIVE   Influenza B by PCR NEGATIVE NEGATIVE    Comment: (NOTE) The Xpert Xpress SARS-CoV-2/FLU/RSV assay is intended as an aid in  the diagnosis of influenza from Nasopharyngeal swab specimens and  should not be used as a sole basis for treatment. Nasal washings and  aspirates are unacceptable for Xpert Xpress SARS-CoV-2/FLU/RSV  testing.  Fact Sheet for Patients: https://www.moore.com/  Fact Sheet for Healthcare Providers: https://www.young.biz/  This test is not yet approved or cleared by the [place] FDA and  has been authorized for detection and/or diagnosis of SARS-CoV-2 by  FDA under an Emergency Use Authorization (EUA). This EUA will remain  in effect (meaning this test can be used) for the duration of the  Covid-19 declaration under Section 564(b)(1) of the Act, 21  U.S.C. section 360bbb-3(b)(1), unless the authorization is  terminated or revoked. Performed at [facility], [address], [place], [place] [address]     DG Chest 1 View  Result Date: 02/18/2020 CLINICAL DATA:  Fall EXAM: CHEST  1 VIEW COMPARISON:  June 29, 2017 FINDINGS: The heart size and mediastinal contours are within normal limits. Both lungs are clear. The visualized skeletal structures are unremarkable. IMPRESSION: No active disease. Electronically Signed   By: Jonna Clark M.D.   On: 02/18/2020 01:02   DG Hip Unilat W or Wo Pelvis 1 View Left  Result Date: 02/18/2020 CLINICAL DATA:  Fall and pain EXAM: DG HIP (WITH OR WITHOUT PELVIS) 1V*L* COMPARISON:  None. FINDINGS: There is a comminuted mildly  displaced fracture of the transcervical left femoral neck. There is superior subluxation of the femoral shaft. The femoral head is still well seated within the acetabulum. No other definite fracture seen. IMPRESSION: Mildly displaced left transcervical femoral neck fracture. Electronically Signed   By: Jonna Clark M.D.   On: 02/18/2020 01:01   Review of Systems  Constitutional: Negative.   HENT: Negative.   Eyes: Negative.   Respiratory: Negative.   Cardiovascular: Negative.   Gastrointestinal: Negative.   Genitourinary: Negative.   Musculoskeletal: Positive for joint pain.  Skin: Negative.   Neurological: Negative.   Endo/Heme/Allergies: Negative.   Psychiatric/Behavioral: Negative.      Blood pressure (!) 144/62, pulse 91, temperature 99 F (37.2 C), temperature source Oral, resp. rate 18, height 4\' 11"  (1.499 m), weight 46.3 kg, SpO2 97 %. Physical Exam Constitutional:      Appearance: She is well-developed.  HENT:     Head: Normocephalic.  Eyes:     Pupils: Pupils are equal, round, and reactive to light.  Neck:     Thyroid: No thyromegaly.     Vascular: No JVD.     Trachea: No tracheal deviation.  Cardiovascular:     Rate and Rhythm: Normal rate and regular rhythm.  Pulmonary:     Effort: Pulmonary effort is normal. No respiratory distress.     Breath sounds: Normal breath sounds. No wheezing.  Abdominal:     Palpations: Abdomen is soft.     Tenderness: There is no abdominal tenderness. There is no guarding.  Musculoskeletal:     Cervical back: Neck supple.     Left hip: Tenderness and bony tenderness present. Decreased range of motion. Decreased strength.  Lymphadenopathy:     Cervical: No cervical adenopathy.  Skin:  General: Skin is warm and dry.  Neurological:     Mental Status: She is alert and oriented to person, place, and time.     Assessment/Plan: Left femoral neck fracture   Plan:  OR later today for hemi vs total hip replacement per Dr.  Charlann Boxer  Risks, benefits and expectations were discussed with the patient.  Risks including but not limited to the risk of anesthesia, blood clots, nerve damage, blood vessel damage, failure of the prosthesis, infection and up to and including death.  Patient understand the risks, benefits and expectations and wishes to proceed with surgery.   NPO now  Urgent ortho order set placed      Genelle Gather [facility] 02/18/2020, 7:52 AM

## 2020-02-18 NOTE — Anesthesia Procedure Notes (Addendum)
Anesthesia Regional Block: Fascia iliaca block   Pre-Anesthetic Checklist: ,, timeout performed, Correct Patient, Correct Site, Correct Laterality, Correct Procedure, Correct Position, site marked, Risks and benefits discussed,  Surgical consent,  Pre-op evaluation,  At surgeon's request and post-op pain management  Laterality: Lower and Left  Prep: chloraprep       Needles:  Injection technique: Single-shot  Needle Type: Echogenic Needle     Needle Length: 9cm  Needle Gauge: 20   Needle insertion depth: 2 cm   Additional Needles:   Procedures:,,,, ultrasound used (permanent image in chart),,,,  Narrative:  Start time: 02/18/2020 4:27 PM End time: 02/18/2020 4:35 PM Injection made incrementally with aspirations every 5 mL.  Performed by: Personally  Anesthesiologist: Leilani Able, MD

## 2020-02-18 NOTE — Progress Notes (Signed)
Patient ID: Wendy Lewis, female   DOB: 08/05/1940, 79 y.o.   MRN: [id number] Consult received Pt to be seen this am Plan to go to operating room this evening to fix

## 2020-02-18 NOTE — Progress Notes (Addendum)
AssistedDr. Arby Barrette with left, ultrasound guided, femoral block. Side rails up, monitors on throughout procedure. See vital signs in flow sheet. Tolerated Procedure well. Time out completed.   Dr. Arby Barrette aware of sensitivity/allergy with Fentynal (nausea) Will monitor as giving.

## 2020-02-18 NOTE — Progress Notes (Signed)
Initial Nutrition Assessment  INTERVENTION:   -Ensure Enlive po BID, each supplement provides 350 kcal and 20 grams of protein -Multivitamin with minerals daily  NUTRITION DIAGNOSIS:   Increased nutrient needs related to hip fracture, post-op healing as evidenced by estimated needs.  GOAL:   Patient will meet greater than or equal to 90% of their needs  MONITOR:   PO intake, Supplement acceptance, Labs, Weight trends, I & O's  REASON FOR ASSESSMENT:   Consult Hip fracture protocol  ASSESSMENT:   79 y.o. female with history of hypertension anemia and cerebral palsy.  She had a fall at home when she was trying to avoid a frog in the kitchen.  She denies hitting her head. Her left hip hurt and she was brought to the ER.  X-rays reveal left hip fracture.  Patient currently in OR for surgery.  Pt is NPO. Will order Ensure and daily MVI to start following diet advancement.  Per weight records, weight has been stable.  Medications reviewed. Labs reviewed: Low Na  NUTRITION - FOCUSED PHYSICAL EXAM:  In OR  Diet Order:   Diet Order            Diet NPO time specified  Diet effective now                 EDUCATION NEEDS:   No education needs have been identified at this time  Skin:  Skin Assessment: Reviewed RN Assessment  Last BM:  10/3  Height:   Ht Readings from Last 1 Encounters:  02/18/20 4\' 11"  (1.499 m)    Weight:   Wt Readings from Last 1 Encounters:  02/18/20 46.3 kg   BMI:  Body mass index is 20.6 kg/m.  Estimated Nutritional Needs:   Kcal:  1250-1450  Protein:  60-75g  Fluid:  1.4L/day  04/19/20, MS, RD, LDN Inpatient Clinical Dietitian Contact information available via Amion

## 2020-02-18 NOTE — Interval H&P Note (Signed)
History and Physical Interval Note:  02/18/2020 4:35 PM  Wendy Lewis  has presented today for surgery, with the diagnosis of fractured hip, left.  The various methods of treatment have been discussed with the patient and family. After consideration of risks, benefits and other options for treatment, the patient has consented to  Procedure(s): TOTAL HIP ARTHROPLASTY ANTERIOR APPROACH (Left) as a surgical intervention.  The patient's history has been reviewed, patient examined, no change in status, stable for surgery.  I have reviewed the patient's chart and labs.  Questions were answered to the patient's satisfaction.     [name]

## 2020-02-18 NOTE — ED Triage Notes (Signed)
Pt BIB EMS from home c/o fall with left leg shortening and external rotation. EMS states pt fell in her kitchen. No LOC. No blood thinners.   18G LAC 200 mcg fentanyl  4 mg Zofran

## 2020-02-18 NOTE — Progress Notes (Signed)
PROGRESS NOTE  Wendy Lewis CXK:[id number] DOB: August 24, 1940   PCP: [name], MD  Patient is from: Home. Lives alone independently.  DOA: 02/18/2020 LOS: 0  Brief Narrative / Interim history: 79 year old female with history of cerebral palsy, HTN and anemia presenting with left hip pain after accidental fall in an attempt to avoid frog in her kitchen. No head trauma or loss of consciousness.  In ED, hemodynamically stable. Na 133. K3.4. Cr 1.19. WBC 12.5. Hgb 11.6. Influenza and COVID-19 PCR negative. CXR without acute finding. Hip x-ray with mildly displaced left transcervical femoral neck fracture. Orthopedic surgery consulted. Plan is for surgical fixation  Subjective: Seen and examined earlier this morning. No major events overnight or this morning. Reports 10/10 pain. She seems to be spastic. Daughter at bedside. Denies chest pain, shortness of GI symptoms.  Objective: Vitals:   02/18/20 0645 02/18/20 0710 02/18/20 0716 02/18/20 1033  BP: (!) 145/83 (!) 144/62  (!) 143/61  Pulse: (!) 109 91  86  Resp: 18 18  14   Temp:  99 F (37.2 C)  99.2 F (37.3 C)  TempSrc:  Oral  Oral  SpO2: 95% 97%  98%  Weight:   46.3 kg   Height:   4\' 11"  (1.499 m)     Intake/Output Summary (Last 24 hours) at 02/18/2020 1330 Last data filed at 02/18/2020 1254 Gross per 24 hour  Intake 0 ml  Output 400 ml  Net -400 ml   Filed Weights   02/18/20 0716  Weight: 46.3 kg    Examination:  GENERAL: No apparent distress.  Nontoxic. HEENT: MMM.  Vision and hearing grossly intact.  NECK: Supple.  No apparent JVD.  RESP:  No IWOB.  Fair aeration bilaterally. CVS:  RRR. Heart sounds normal.  ABD/GI/GU: BS+. Abd soft, NTND.  MSK/EXT: Left hip tenderness and decreased range of motion. SKIN: no apparent skin lesion or wound NEURO: Awake, alert and oriented appropriately.  No apparent focal neuro deficit but limited exam due to pain PSYCH: Calm. Normal affect.   Procedures:   None  Microbiology summarized: COVID-19 PCR negative. Influenza PCR negative.  Assessment & Plan: Accidental fall at home Left femoral neck fracture-mildly displaced left transcervical femoral neck fracture on x-ray. -Plan for surgical repair later today -Increase IV morphine to 2 mg every 2 hours as needed -Start IV fluid while n.p.o. -Stable for surgery from cardiopulmonary standpoint  Essential hypertension -As needed hydralazine  Hyponatremia/hypokalemia: Likely due to lisinopril/HCTZ -Hold lisinopril/HCTZ -As needed hydralazine for blood pressure -Recheck labs in the afternoon  Normocytic anemia: Hgb at baseline. -Continue monitoring  History of cerebral palsy: Independent at baseline. -PT/OT after surgery  Body mass index is 20.6 kg/m.         DVT prophylaxis:  SCDs Start: 02/18/20 0316  Code Status: Full code Family Communication: Updated patient's daughter at bedside. Status is: Inpatient  Remains inpatient appropriate because:Unsafe d/c plan, IV treatments appropriate due to intensity of illness or inability to take PO and Inpatient level of care appropriate due to severity of illness   Dispo: The patient is from: Home              Anticipated d/c is to: To be determined              Anticipated d/c date is: 3 days              Patient currently is not medically stable to d/c.       Consultants:  Orthopedic  surgery   Sch Meds:  Scheduled Meds: Continuous Infusions: . 0.9 % NaCl with KCl 20 mEq / L 50 mL/hr at 02/18/20 1146   PRN Meds:.hydrALAZINE, morphine injection  Antimicrobials: Anti-infectives (From admission, onward)   None       I have personally reviewed the following labs and images: CBC: Recent Labs  Lab 02/18/20 0041 02/18/20 1308  WBC 12.5*  --   NEUTROABS 9.6*  --   HGB 11.6* 11.8*  HCT 33.5* 34.3*  MCV 91.8  --   PLT 209  --    BMP &GFR Recent Labs  Lab 02/18/20 0041  NA 133*  K 3.4*  CL 97*  CO2 25   GLUCOSE 158*  BUN 16  CREATININE 1.19*  CALCIUM 8.8*   Estimated Creatinine Clearance: 26.6 mL/min (A) (by C-G formula based on SCr of 1.19 mg/dL (H)). Liver & Pancreas: Recent Labs  Lab 02/18/20 0041  AST 32  ALT 19  ALKPHOS 65  BILITOT 0.6  PROT 6.6  ALBUMIN 4.0   No results for input(s): LIPASE, AMYLASE in the last 168 hours. No results for input(s): AMMONIA in the last 168 hours. Diabetic: Recent Labs    02/18/20 0041  HGBA1C 5.9*   No results for input(s): GLUCAP in the last 168 hours. Cardiac Enzymes: No results for input(s): CKTOTAL, CKMB, CKMBINDEX, TROPONINI in the last 168 hours. No results for input(s): PROBNP in the last 8760 hours. Coagulation Profile: Recent Labs  Lab 02/18/20 0041  INR 1.0   Thyroid Function Tests: No results for input(s): TSH, T4TOTAL, FREET4, T3FREE, THYROIDAB in the last 72 hours. Lipid Profile: No results for input(s): CHOL, HDL, LDLCALC, TRIG, CHOLHDL, LDLDIRECT in the last 72 hours. Anemia Panel: No results for input(s): VITAMINB12, FOLATE, FERRITIN, TIBC, IRON, RETICCTPCT in the last 72 hours. Urine analysis:    Component Value Date/Time   COLORURINE YELLOW 07/03/2017 1145   APPEARANCEUR CLEAR 07/03/2017 1145   LABSPEC 1.020 07/03/2017 1145   PHURINE 5.0 07/03/2017 1145   GLUCOSEU NEGATIVE 07/03/2017 1145   HGBUR NEGATIVE 07/03/2017 1145   BILIRUBINUR NEGATIVE 07/03/2017 1145   KETONESUR 5 (A) 07/03/2017 1145   PROTEINUR NEGATIVE 07/03/2017 1145   NITRITE NEGATIVE 07/03/2017 1145   LEUKOCYTESUR MODERATE (A) 07/03/2017 1145   Sepsis Labs: Invalid input(s): PROCALCITONIN, LACTICIDVEN  Microbiology: Recent Results (from the past 240 hour(s))  Respiratory Panel by RT PCR (Flu A&B, Covid) - Nasopharyngeal Swab     Status: None   Collection Time: 02/18/20  1:02 AM   Specimen: Nasopharyngeal Swab  Result Value Ref Range Status   SARS Coronavirus 2 by RT PCR NEGATIVE NEGATIVE Final    Comment: (NOTE) SARS-CoV-2 target  nucleic acids are NOT DETECTED.  The SARS-CoV-2 RNA is generally detectable in upper respiratoy specimens during the acute phase of infection. The lowest concentration of SARS-CoV-2 viral copies this assay can detect is 131 copies/mL. A negative result does not preclude SARS-Cov-2 infection and should not be used as the sole basis for treatment or other patient management decisions. A negative result may occur with  improper specimen collection/handling, submission of specimen other than nasopharyngeal swab, presence of viral mutation(s) within the areas targeted by this assay, and inadequate number of viral copies (<131 copies/mL). A negative result must be combined with clinical observations, patient history, and epidemiological information. The expected result is Negative.  Fact Sheet for Patients:  https://www.moore.com/  Fact Sheet for Healthcare Providers:  https://www.young.biz/  This test is no t yet approved or cleared  by the [place] and  has been authorized for detection and/or diagnosis of SARS-CoV-2 by FDA under an Emergency Use Authorization (EUA). This EUA will remain  in effect (meaning this test can be used) for the duration of the COVID-19 declaration under Section 564(b)(1) of the Act, 21 U.S.C. section 360bbb-3(b)(1), unless the authorization is terminated or revoked sooner.     Influenza A by PCR NEGATIVE NEGATIVE Final   Influenza B by PCR NEGATIVE NEGATIVE Final    Comment: (NOTE) The Xpert Xpress SARS-CoV-2/FLU/RSV assay is intended as an aid in  the diagnosis of influenza from Nasopharyngeal swab specimens and  should not be used as a sole basis for treatment. Nasal washings and  aspirates are unacceptable for Xpert Xpress SARS-CoV-2/FLU/RSV  testing.  Fact Sheet for Patients: https://www.moore.com/  Fact Sheet for Healthcare  Providers: https://www.young.biz/  This test is not yet approved or cleared by the [place] FDA and  has been authorized for detection and/or diagnosis of SARS-CoV-2 by  FDA under an Emergency Use Authorization (EUA). This EUA will remain  in effect (meaning this test can be used) for the duration of the  Covid-19 declaration under Section 564(b)(1) of the Act, 21  U.S.C. section 360bbb-3(b)(1), unless the authorization is  terminated or revoked. Performed at [facility], [address] [place], [place] [address]     Radiology Studies: DG Chest 1 View  Result Date: 02/18/2020 CLINICAL DATA:  Fall EXAM: CHEST  1 VIEW COMPARISON:  June 29, 2017 FINDINGS: The heart size and mediastinal contours are within normal limits. Both lungs are clear. The visualized skeletal structures are unremarkable. IMPRESSION: No active disease. Electronically Signed   By: Jonna Clark M.D.   On: 02/18/2020 01:02   DG Hip Unilat W or Wo Pelvis 1 View Left  Result Date: 02/18/2020 CLINICAL DATA:  Fall and pain EXAM: DG HIP (WITH OR WITHOUT PELVIS) 1V*L* COMPARISON:  None. FINDINGS: There is a comminuted mildly displaced fracture of the transcervical left femoral neck. There is superior subluxation of the femoral shaft. The femoral head is still well seated within the acetabulum. No other definite fracture seen. IMPRESSION: Mildly displaced left transcervical femoral neck fracture. Electronically Signed   By: Jonna Clark M.D.   On: 02/18/2020 01:01     Taronda Comacho T. Chais Fehringer [facility]  If 7PM-7AM, please contact night-coverage [email or web address] 02/18/2020, 1:30 PM

## 2020-02-18 NOTE — Transfer of Care (Signed)
Immediate Anesthesia Transfer of Care Note  Patient: Wendy Lewis  Procedure(s) Performed: TOTAL HIP ARTHROPLASTY ANTERIOR APPROACH (Left Hip)  Patient Location: PACU  Anesthesia Type:GA combined with regional for post-op pain  Level of Consciousness: drowsy and patient cooperative  Airway & Oxygen Therapy: Patient Spontanous Breathing and Patient connected to face mask oxygen  Post-op Assessment: Report given to RN and Post -op Vital signs reviewed and stable  Post vital signs: Reviewed and stable  Last Vitals:  Vitals Value Taken Time  BP 63/36 02/18/20 1841  Temp 36.6 C 02/18/20 1840  Pulse 99 02/18/20 1844  Resp 16 02/18/20 1844  SpO2 99 % 02/18/20 1844  Vitals shown include unvalidated device data.  Last Pain:  Vitals:   02/18/20 1637  TempSrc:   PainSc: 7          Complications: No complications documented.

## 2020-02-18 NOTE — Op Note (Signed)
NAME:  Wendy Lewis.: [id number]      MEDICAL RECORD NO.: [id number]      FACILITY:  [facility]      PHYSICIAN:  [name]  DATE OF BIRTH:  1941-03-10     DATE OF PROCEDURE:  02/18/2020                                 OPERATIVE REPORT         PREOPERATIVE DIAGNOSIS: Left hip femoral neck fracture.      POSTOPERATIVE DIAGNOSIS:  Left hip femoral neck fracture.      PROCEDURE:  Left total hip replacement through an anterior approach   utilizing DePuy THR system, component size 50 mm pinnacle cup, a size 32+4 neutral   Altrex liner, a size 2 Hi Actis stem with a 32+1 Articuleze metal head ball.      SURGEON:  Madlyn Frankel. Charlann Boxer, M.D.      ASSISTANT:  Dennie Bible, PA-C     ANESTHESIA:  General and Regional.      SPECIMENS:  None.      COMPLICATIONS:  None.      BLOOD LOSS:  300 cc     DRAINS:  None.      INDICATION OF THE PROCEDURE:  Wendy Lewis is a 79 y.o. female withhistory of hypertension anemia and cerebral palsy.  She had a fall at home when she was trying to avoid a frog in the kitchen. She denies hitting her head.Her left hip hurt and she was brought to the ER.  X-rays reveal left hip fracture. Dr. Charlann Boxer on-call orthopedic surgeon was consulted.  Patient admitted for surgery to fix the fracture. Risks, benefits and expectations were discussed with the patient.  Risks including but not limited to the risk of anesthesia, blood clots, nerve damage, blood vessel damage, failure of the prosthesis, infection and up to and including death.  Patient understand the risks, benefits and expectations and wishes to proceed with surgery     PROCEDURE IN DETAIL:  The patient was brought to operative theater.   Once adequate anesthesia, preoperative antibiotics, 2 gm of Ancef, 1 gm of Tranexamic Acid, and 10 mg of Decadron were administered, the patient was positioned supine on the [employer] table.  Once the patient was safely  positioned with adequate padding of boney prominences we predraped out the hip, and used fluoroscopy to confirm orientation of the pelvis.      The left hip was then prepped and draped from proximal iliac crest to   mid thigh with a shower curtain technique.      Time-out was performed identifying the patient, planned procedure, and the appropriate extremity.     An incision was then made 2 cm lateral to the   anterior superior iliac spine extending over the orientation of the   tensor fascia lata muscle and sharp dissection was carried down to the   fascia of the muscle.      The fascia was then incised.  The muscle belly was identified and swept   laterally and retractor placed along the superior neck.  Following   cauterization of the circumflex vessels and removing some pericapsular   fat, a second cobra retractor was placed on the inferior neck.  A T-capsulotomy was made along  the line of the   superior neck to the trochanteric fossa, then extended proximally and   distally.  Tag sutures were placed and the retractors were then placed   intracapsular.  We then identified the trochanteric fossa and   orientation of my neck cut and then made a neck osteotomy with the femur on traction.  The femoral   head was removed without difficulty or complication.  Traction was let   off and retractors were placed posterior and anterior around the   acetabulum.      The labrum and foveal tissue were debrided.  I began reaming with a 44 mm   reamer and reamed up to 49 mm reamer with good bony bed preparation and a 50 mm  cup was chosen.  The final 50 mm Pinnacle cup was then impacted under fluoroscopy to confirm the depth of penetration and orientation with respect to   Abduction and forward flexion.  A screw was placed into the ilium followed by the hole eliminator.  The final   32+4 neutral Altrex liner was impacted with good visualized rim fit.  The cup was positioned anatomically within the  acetabular portion of the pelvis.      At this point, the femur was rolled to 100 degrees.  Further capsule was   released off the inferior aspect of the femoral neck.  I then   released the superior capsule proximally.  With the leg in a neutral position the hook was placed laterally   along the femur under the vastus lateralis origin and elevated manually and then held in position using the hook attachment on the bed.  The leg was then extended and adducted with the leg rolled to 100   degrees of external rotation.  Retractors were placed along the medial calcar and posteriorly over the greater trochanter.  Once the proximal femur was fully   exposed, I used a box osteotome to set orientation.  I then began   broaching with the starting chili pepper broach and passed this by hand and then broached up to 2.  With the 2 broach in place I chose a high offset neck and did several trial reductions.  The offset was appropriate, leg lengths   appeared to be equal best matched with the +1 head ball trial confirmed radiographically.   Given these findings, I went ahead and dislocated the hip, repositioned all   retractors and positioned the right hip in the extended and abducted position.  The final 2 Hi Actis stem was   chosen and it was impacted down to the level of neck cut.  Based on this   and the trial reductions, a final 32+1 Articuleze metal head ball was chosen and   impacted onto a clean and dry trunnion, and the hip was reduced.  The   hip had been irrigated throughout the case again at this point.  I did   reapproximate the superior capsular leaflet to the anterior leaflet   using #1 Vicryl.  The fascia of the   tensor fascia lata muscle was then reapproximated using #1 Vicryl and #0 Stratafix sutures.  The   remaining wound was closed with 2-0 Vicryl and running 4-0 Monocryl.   The hip was cleaned, dried, and dressed sterilely using Dermabond and   Aquacel dressing.  The patient was then  brought   to recovery room in stable condition tolerating the procedure well.    Dennie Bible, PA-C was present for the  entirety of the case involved from   preoperative positioning, perioperative retractor management, general   facilitation of the case, as well as primary wound closure as assistant.            Madlyn Frankel Charlann Boxer, M.D.        02/18/2020 4:56 PM

## 2020-02-18 NOTE — Consult Note (Signed)
Reason for Consult: Left femoral neck fracture Referring Physician: ED Provider  Wendy Lewis is an 79 y.o. female.  HPI: The patient is a  79 y.o. female with history of hypertension anemia and cerebral palsy.  She had a fall at home when she was trying to avoid a frog in the kitchen.  She denies hitting her head. Her left hip hurt and she was brought to the ER.  X-rays reveal left hip fracture.  Dr. Charlann Boxer on-call orthopedic surgeon was consulted.  Patient admitted for surgery to fix the fracture. Risks, benefits and expectations were discussed with the patient.  Risks including but not limited to the risk of anesthesia, blood clots, nerve damage, blood vessel damage, failure of the prosthesis, infection and up to and including death.  Patient understand the risks, benefits and expectations and wishes to proceed with surgery.    .  Past Medical History:  Diagnosis Date  . Bursitis   . CP (cerebral palsy) (HCC)   . Hypertension   . Lichen sclerosus 05/2015   vulvar biopsy  . Osteopenia 10/2017   T score -1.5 FRAX 9.6% / 2.1%.  Stable from prior DEXA.    Past Surgical History:  Procedure Laterality Date  . ABDOMINAL HYSTERECTOMY  1975   TAH for cervical dysplasia  . CATARACT EXTRACTION     Bilateral  . TONSILLECTOMY      Family History  Problem Relation Age of Onset  . Diabetes Mother   . Hypertension Mother   . Heart disease Mother   . Cancer Sister        THYROID  . Cancer Paternal Grandmother        UTERINE OR OVARIAN?  Marland Kitchen Hypertension Daughter     Social History:  reports that she has quit smoking. She has never used smokeless tobacco. She reports that she does not drink alcohol and does not use drugs.  Allergies:  Allergies  Allergen Reactions  . Fentanyl And Related     Causes  nausea     Medications: I have reviewed the patient's current medications.  Results for orders placed or performed during the hospital encounter of 02/18/20 (from the past 48 hour(s))   Comprehensive metabolic panel     Status: Abnormal   Collection Time: 02/18/20 12:41 AM  Result Value Ref Range   Sodium 133 (L) 135 - 145 mmol/L   Potassium 3.4 (L) 3.5 - 5.1 mmol/L   Chloride 97 (L) 98 - 111 mmol/L   CO2 25 22 - 32 mmol/L   Glucose, Bld 158 (H) 70 - 99 mg/dL    Comment: Glucose reference range applies only to samples taken after fasting for at least 8 hours.   BUN 16 8 - 23 mg/dL   Creatinine, Ser 1.32 (H) 0.44 - 1.00 mg/dL   Calcium 8.8 (L) 8.9 - 10.3 mg/dL   Total Protein 6.6 6.5 - 8.1 g/dL   Albumin 4.0 3.5 - 5.0 g/dL   AST 32 15 - 41 U/L   ALT 19 0 - 44 U/L   Alkaline Phosphatase 65 38 - 126 U/L   Total Bilirubin 0.6 0.3 - 1.2 mg/dL   GFR calc non Af Amer 44 (L) >60 mL/min   GFR calc Af Amer 51 (L) >60 mL/min   Anion gap 11 5 - 15    Comment: Performed at [facility], [address] [place], [place] [address]  CBC with Differential     Status: Abnormal   Collection  Time: 02/18/20 12:41 AM  Result Value Ref Range   WBC 12.5 (H) 4.0 - 10.5 K/uL   RBC 3.65 (L) 3.87 - 5.11 MIL/uL   Hemoglobin 11.6 (L) 12.0 - 15.0 g/dL   HCT 33.5 (L) 36 - 46 %   MCV 91.8 80.0 - 100.0 fL   MCH 31.8 26.0 - 34.0 pg   MCHC 34.6 30.0 - 36.0 g/dL   RDW 12.5 11.5 - 15.5 %   Platelets 209 150 - 400 K/uL   nRBC 0.0 0.0 - 0.2 %   Neutrophils Relative % 77 %   Neutro Abs 9.6 (H) 1.7 - 7.7 K/uL   Lymphocytes Relative 14 %   Lymphs Abs 1.8 0.7 - 4.0 K/uL   Monocytes Relative 7 %   Monocytes Absolute 0.9 0 - 1 K/uL   Eosinophils Relative 1 %   Eosinophils Absolute 0.1 0 - 0 K/uL   Basophils Relative 0 %   Basophils Absolute 0.0 0 - 0 K/uL   Immature Granulocytes 1 %   Abs Immature Granulocytes 0.08 (H) 0.00 - 0.07 K/uL    Comment: Performed at [facility], [address] [place], [place] [address]  Protime-INR     Status: None   Collection Time: 02/18/20 12:41 AM  Result Value Ref Range   Prothrombin Time 12.6 11.4 - 15.2 seconds    INR 1.0 0.8 - 1.2    Comment: (NOTE) INR goal varies based on device and disease states. Performed at [facility], [address] [place], [place] [address]   Hemoglobin A1c     Status: Abnormal   Collection Time: 02/18/20 12:41 AM  Result Value Ref Range   Hgb A1c MFr Bld 5.9 (H) 4.8 - 5.6 %    Comment: (NOTE) Pre diabetes:          5.7%-6.4%  Diabetes:              >6.4%  Glycemic control for   <7.0% adults with diabetes    Mean Plasma Glucose 122.63 mg/dL    Comment: Performed at [facility], [address] [place], [place] [address]  Respiratory Panel by RT PCR (Flu A&B, Covid) - Nasopharyngeal Swab     Status: None   Collection Time: 02/18/20  1:02 AM   Specimen: Nasopharyngeal Swab  Result Value Ref Range   SARS Coronavirus 2 by RT PCR NEGATIVE NEGATIVE    Comment: (NOTE) SARS-CoV-2 target nucleic acids are NOT DETECTED.  The SARS-CoV-2 RNA is generally detectable in upper respiratoy specimens during the acute phase of infection. The lowest concentration of SARS-CoV-2 viral copies this assay can detect is 131 copies/mL. A negative result does not preclude SARS-Cov-2 infection and should not be used as the sole basis for treatment or other patient management decisions. A negative result may occur with  improper specimen collection/handling, submission of specimen other than nasopharyngeal swab, presence of viral mutation(s) within the areas targeted by this assay, and inadequate number of viral copies (<131 copies/mL). A negative result must be combined with clinical observations, patient history, and epidemiological information. The expected result is Negative.  Fact Sheet for Patients:  https://www.fda.gov/media/142436/download  Fact Sheet for Healthcare Providers:  https://www.fda.gov/media/142435/download  This test is no t yet approved or cleared by the [place] FDA and  has been authorized for detection and/or diagnosis of  SARS-CoV-2 by FDA under an Emergency Use Authorization (EUA). This EUA will remain  in effect (meaning this test can be   used) for the duration of the COVID-19 declaration under Section 564(b)(1) of the Act, 21 U.S.C. section 360bbb-3(b)(1), unless the authorization is terminated or revoked sooner.     Influenza A by PCR NEGATIVE NEGATIVE   Influenza B by PCR NEGATIVE NEGATIVE    Comment: (NOTE) The Xpert Xpress SARS-CoV-2/FLU/RSV assay is intended as an aid in  the diagnosis of influenza from Nasopharyngeal swab specimens and  should not be used as a sole basis for treatment. Nasal washings and  aspirates are unacceptable for Xpert Xpress SARS-CoV-2/FLU/RSV  testing.  Fact Sheet for Patients: https://www.moore.com/  Fact Sheet for Healthcare Providers: https://www.young.biz/  This test is not yet approved or cleared by the [place] FDA and  has been authorized for detection and/or diagnosis of SARS-CoV-2 by  FDA under an Emergency Use Authorization (EUA). This EUA will remain  in effect (meaning this test can be used) for the duration of the  Covid-19 declaration under Section 564(b)(1) of the Act, 21  U.S.C. section 360bbb-3(b)(1), unless the authorization is  terminated or revoked. Performed at [facility], [address], [place], [place] [address]     DG Chest 1 View  Result Date: 02/18/2020 CLINICAL DATA:  Fall EXAM: CHEST  1 VIEW COMPARISON:  June 29, 2017 FINDINGS: The heart size and mediastinal contours are within normal limits. Both lungs are clear. The visualized skeletal structures are unremarkable. IMPRESSION: No active disease. Electronically Signed   By: Jonna Clark M.D.   On: 02/18/2020 01:02   DG Hip Unilat W or Wo Pelvis 1 View Left  Result Date: 02/18/2020 CLINICAL DATA:  Fall and pain EXAM: DG HIP (WITH OR WITHOUT PELVIS) 1V*L* COMPARISON:  None. FINDINGS: There is a comminuted mildly  displaced fracture of the transcervical left femoral neck. There is superior subluxation of the femoral shaft. The femoral head is still well seated within the acetabulum. No other definite fracture seen. IMPRESSION: Mildly displaced left transcervical femoral neck fracture. Electronically Signed   By: Jonna Clark M.D.   On: 02/18/2020 01:01   Review of Systems  Constitutional: Negative.   HENT: Negative.   Eyes: Negative.   Respiratory: Negative.   Cardiovascular: Negative.   Gastrointestinal: Negative.   Genitourinary: Negative.   Musculoskeletal: Positive for joint pain.  Skin: Negative.   Neurological: Negative.   Endo/Heme/Allergies: Negative.   Psychiatric/Behavioral: Negative.      Blood pressure (!) 144/62, pulse 91, temperature 99 F (37.2 C), temperature source Oral, resp. rate 18, height 4\' 11"  (1.499 m), weight 46.3 kg, SpO2 97 %. Physical Exam Constitutional:      Appearance: She is well-developed.  HENT:     Head: Normocephalic.  Eyes:     Pupils: Pupils are equal, round, and reactive to light.  Neck:     Thyroid: No thyromegaly.     Vascular: No JVD.     Trachea: No tracheal deviation.  Cardiovascular:     Rate and Rhythm: Normal rate and regular rhythm.  Pulmonary:     Effort: Pulmonary effort is normal. No respiratory distress.     Breath sounds: Normal breath sounds. No wheezing.  Abdominal:     Palpations: Abdomen is soft.     Tenderness: There is no abdominal tenderness. There is no guarding.  Musculoskeletal:     Cervical back: Neck supple.     Left hip: Tenderness and bony tenderness present. Decreased range of motion. Decreased strength.  Lymphadenopathy:     Cervical: No cervical adenopathy.  Skin:  General: Skin is warm and dry.  Neurological:     Mental Status: She is alert and oriented to person, place, and time.     Assessment/Plan: Left femoral neck fracture   Plan:  OR later today for hemi vs total hip replacement per Dr.  Charlann Boxer  Risks, benefits and expectations were discussed with the patient.  Risks including but not limited to the risk of anesthesia, blood clots, nerve damage, blood vessel damage, failure of the prosthesis, infection and up to and including death.  Patient understand the risks, benefits and expectations and wishes to proceed with surgery.   NPO now  Urgent ortho order set placed      Genelle Gather [facility] 02/18/2020, 7:52 AM

## 2020-02-18 NOTE — ED Notes (Signed)
Patient able to use purewick for urination. 150cc charted in output

## 2020-02-18 NOTE — Anesthesia Procedure Notes (Signed)
Procedure Name: Intubation Date/Time: 02/18/2020 4:55 PM Performed by: Lollie Sails, CRNA Pre-anesthesia Checklist: Patient identified, Emergency Drugs available, Suction available, Patient being monitored and Timeout performed Patient Re-evaluated:Patient Re-evaluated prior to induction Oxygen Delivery Method: Circle system utilized Preoxygenation: Pre-oxygenation with 100% oxygen Induction Type: IV induction Ventilation: Mask ventilation without difficulty Laryngoscope Size: 3 and Mac Grade View: Grade II Tube type: Oral Tube size: 7.0 mm Number of attempts: 1 Airway Equipment and Method: Stylet Placement Confirmation: ETT inserted through vocal cords under direct vision,  positive ETCO2 and breath sounds checked- equal and bilateral Secured at: 22 cm Tube secured with: Tape Dental Injury: Teeth and Oropharynx as per pre-operative assessment

## 2020-02-18 NOTE — Anesthesia Postprocedure Evaluation (Signed)
Anesthesia Post Note  Patient: Wendy Lewis  Procedure(s) Performed: TOTAL HIP ARTHROPLASTY ANTERIOR APPROACH (Left Hip)     Patient location during evaluation: PACU Anesthesia Type: General Level of consciousness: awake and sedated Pain management: pain level controlled Vital Signs Assessment: post-procedure vital signs reviewed and stable Respiratory status: spontaneous breathing Cardiovascular status: stable Postop Assessment: no apparent nausea or vomiting Anesthetic complications: no   No complications documented.  Last Vitals:  Vitals:   02/18/20 1840 02/18/20 1845  BP: 99/81 (!) 112/93  Pulse: 96 99  Resp: 17 16  Temp: 36.6 C   SpO2: 99% 99%    Last Pain:  Vitals:   02/18/20 1840  TempSrc:   PainSc: Asleep                 Caren Macadam

## 2020-02-18 NOTE — H&P (Signed)
History and Physical    Wendy Lewis ESP:[id number] DOB: 19-Jun-1940 DOA: 02/18/2020  PCP: Wendy Montana, MD  Patient coming from: Home.  Chief Complaint: Fall.  HPI: Wendy Lewis is a 79 y.o. female with history of hypertension anemia and cerebral palsy had a fall at home when she was trying to avoid a frog in the kitchen.  When she fell patient did not hit head or did not have any chest pain or shortness of breath.  Her left hip hurt and she was brought to the ER.  ED Course: X-rays reveal left hip fracture.  Labs reveal mildly elevated creatinine increased from baseline when compared to 2019.  Hemoglobin is around 11.6 WBC 12.5.  EKG shows normal sinus rhythm chest x-ray unremarkable.  Dr. Charlann Boxer on-call orthopedic surgeon was consulted patient admitted for surgery.  Patient's potassium was 3.4 for which patient was given p.o. potassium replacement blood sugar was around 158 which we will be checking hemoglobin A1c.  Covid test was negative.  Review of Systems: As per HPI, rest all negative.   Past Medical History:  Diagnosis Date  . Bursitis   . CP (cerebral palsy) (HCC)   . Hypertension   . Lichen sclerosus 05/2015   vulvar biopsy  . Osteopenia 10/2017   T score -1.5 FRAX 9.6% / 2.1%.  Stable from prior DEXA.    Past Surgical History:  Procedure Laterality Date  . ABDOMINAL HYSTERECTOMY  1975   TAH for cervical dysplasia  . CATARACT EXTRACTION     Bilateral  . TONSILLECTOMY       reports that she has quit smoking. She has never used smokeless tobacco. She reports that she does not drink alcohol and does not use drugs.  Allergies  Allergen Reactions  . Fentanyl And Related     Causes  nausea     Family History  Problem Relation Age of Onset  . Diabetes Mother   . Hypertension Mother   . Heart disease Mother   . Cancer Sister        THYROID  . Cancer Paternal Grandmother        UTERINE OR OVARIAN?  Marland Kitchen Hypertension Daughter     Prior to Admission  medications   Medication Sig Start Date End Date Taking? Authorizing Provider  Cholecalciferol (VITAMIN D PO) Take 1 tablet by mouth daily.    Yes [provider]  clobetasol cream (TEMOVATE) 0.05 % Apply 1 application topically 2 (two) times daily. 05/16/17  Yes Fontaine, Nadyne Coombes, MD  lisinopril-hydrochlorothiazide (ZESTORETIC) 10-12.5 MG tablet Take 1 tablet by mouth daily.   Yes [provider]  Multiple Vitamin (MULTIVITAMIN WITH MINERALS) TABS tablet Take 1 tablet by mouth daily.   Yes [provider]    Physical Exam: Constitutional: Moderately built and nourished. Vitals:   02/18/20 0030 02/18/20 0045 02/18/20 0100 02/18/20 0115  BP: (!) 149/77 135/85 115/76 (!) 107/97  Pulse: 86 88 86 87  Resp: (!) 25 19 15 16   Temp:      TempSrc:      SpO2: 97% 96% 98% 97%   Eyes: Anicteric no pallor. ENMT: No discharge from the ears eyes nose or mouth. Neck: No mass felt.  No neck rigidity. Respiratory: No rhonchi or crepitations. Cardiovascular: S1-S2 heard. Abdomen: Soft nontender bowel sounds present. Musculoskeletal: Pain on moving left hip. Skin: No rash. Neurologic: Alert awake oriented to time place and person.  Moves all extremities. Psychiatric: Appears normal.  Normal affect.  Labs on Admission: I have personally reviewed following labs and imaging studies  CBC: Recent Labs  Lab 02/18/20 0041  WBC 12.5*  NEUTROABS 9.6*  HGB 11.6*  HCT 33.5*  MCV 91.8  PLT 209   Basic Metabolic Panel: Recent Labs  Lab 02/18/20 0041  NA 133*  K 3.4*  CL 97*  CO2 25  GLUCOSE 158*  BUN 16  CREATININE 1.19*  CALCIUM 8.8*   GFR: CrCl cannot be calculated (Unknown ideal weight.). Liver Function Tests: Recent Labs  Lab 02/18/20 0041  AST 32  ALT 19  ALKPHOS 65  BILITOT 0.6  PROT 6.6  ALBUMIN 4.0   No results for input(s): LIPASE, AMYLASE in the last 168 hours. No results for input(s): AMMONIA in the last 168 hours. Coagulation  Profile: Recent Labs  Lab 02/18/20 0041  INR 1.0   Cardiac Enzymes: No results for input(s): CKTOTAL, CKMB, CKMBINDEX, TROPONINI in the last 168 hours. BNP (last 3 results) No results for input(s): PROBNP in the last 8760 hours. HbA1C: No results for input(s): HGBA1C in the last 72 hours. CBG: No results for input(s): GLUCAP in the last 168 hours. Lipid Profile: No results for input(s): CHOL, HDL, LDLCALC, TRIG, CHOLHDL, LDLDIRECT in the last 72 hours. Thyroid Function Tests: No results for input(s): TSH, T4TOTAL, FREET4, T3FREE, THYROIDAB in the last 72 hours. Anemia Panel: No results for input(s): VITAMINB12, FOLATE, FERRITIN, TIBC, IRON, RETICCTPCT in the last 72 hours. Urine analysis:    Component Value Date/Time   COLORURINE YELLOW 07/03/2017 1145   APPEARANCEUR CLEAR 07/03/2017 1145   LABSPEC 1.020 07/03/2017 1145   PHURINE 5.0 07/03/2017 1145   GLUCOSEU NEGATIVE 07/03/2017 1145   HGBUR NEGATIVE 07/03/2017 1145   BILIRUBINUR NEGATIVE 07/03/2017 1145   KETONESUR 5 (A) 07/03/2017 1145   PROTEINUR NEGATIVE 07/03/2017 1145   NITRITE NEGATIVE 07/03/2017 1145   LEUKOCYTESUR MODERATE (A) 07/03/2017 1145   Sepsis Labs: @LABRCNTIP (procalcitonin:4,lacticidven:4) )No results found for this or any previous visit (from the past 240 hour(s)).   Radiological Exams on Admission: DG Chest 1 View  Result Date: 02/18/2020 CLINICAL DATA:  Fall EXAM: CHEST  1 VIEW COMPARISON:  June 29, 2017 FINDINGS: The heart size and mediastinal contours are within normal limits. Both lungs are clear. The visualized skeletal structures are unremarkable. IMPRESSION: No active disease. Electronically Signed   By: July 01, 2017 M.D.   On: 02/18/2020 01:02   DG Hip Unilat W or Wo Pelvis 1 View Left  Result Date: 02/18/2020 CLINICAL DATA:  Fall and pain EXAM: DG HIP (WITH OR WITHOUT PELVIS) 1V*L* COMPARISON:  None. FINDINGS: There is a comminuted mildly displaced fracture of the transcervical left  femoral neck. There is superior subluxation of the femoral shaft. The femoral head is still well seated within the acetabulum. No other definite fracture seen. IMPRESSION: Mildly displaced left transcervical femoral neck fracture. Electronically Signed   By: 04/19/2020 M.D.   On: 02/18/2020 01:01    EKG: Independently reviewed.  Normal sinus rhythm.  Assessment/Plan Principal Problem:   Closed left hip fracture, initial encounter (HCC) Active Problems:   CP (cerebral palsy) (HCC)   Hypertension   Chronic anemia   Closed left hip fracture (HCC)    1. Left hip fracture status post mechanical fall for which patient will be kept n.p.o. pain relief medication and Dr. 04/19/2020 orthopedic surgery has been consulted and further recommendations per orthopedics. 2. Hypertension -since patient is going to be n.p.o. and has worsening renal function will hold lisinopril  hydrochlorothiazide and gently hydrate and keep patient as needed IV hydralazine. 3. Acute renal failure -we will gently hydrate cause not clear.  Will hold lisinopril hydrochlorothiazide for now.  Check UA. 4. Mild hypokalemia replace and recheck. 5. Anemia appears to be chronic follow CBC. 6. Leukocytosis could be reactionary.  Follow CBC patient is afebrile. 7. History of cerebral palsy.  Since patient has hip fracture will need more than 2 midnight stay in inpatient status.   DVT prophylaxis: SCDs.  Avoiding anticoagulation until surgery.  Pharmacological anticoagulation can be started once okay with orthopedic surgery. Code Status: Full code. Family Communication: Patient's daughter. Disposition Plan: May need rehab. Consults called: Orthopedic surgery. Admission status: Inpatient.   Eduard Clos MD [facility] Pager [phone].  If 7PM-7AM, please contact night-coverage [email or web address] Password [email or web address]  02/18/2020, 3:17 AM

## 2020-02-18 NOTE — TOC Initial Note (Signed)
Transition of Care [facility]) - Initial/Assessment Note    Patient Details  Name: Wendy Lewis MRN: [id number] Date of Birth: 07-17-1940  Transition of Care [facility]) CM/SW Contact:    Lennart Pall, LCSW Phone Number: 02/18/2020, 10:34 AM  Clinical Narrative:                 Met with pt and daughter this morning to review living situation and support available with pending ortho surgery anticipated this afternoon.  They confirm that pt lives alone and was completely independent, still driving, etc.  Two daughters living close by and both working from home and available to assist.  Daughter, Jeannene Patella, states that she plans to bring pt to her home after surgery  - notes this is a one level home with only 3 steps to enter.  We did review HH follow up vs short term rehab, however, will await therapy input following surgery.  TOC to continue following.  Expected Discharge Plan: [place] Barriers to Discharge: Continued Medical Work up   Patient Goals and CMS Choice Patient states their goals for this hospitalization and ongoing recovery are:: to return home CMS Medicare.gov Compare Post Acute Care list provided to:: Patient    Expected Discharge Plan and Services Expected Discharge Plan: [place] In-house Referral: Clinical Social Work     Living arrangements for the past 2 months: [place]                                      Prior Living Arrangements/Services Living arrangements for the past 2 months: Single Family Home Lives with:: Self Patient language and need for interpreter reviewed:: Yes Do you feel safe going back to the place where you live?: Yes      Need for Family Participation in Patient Care: Yes (Comment) Care giver support system in place?: Yes (comment)   Criminal Activity/Legal Involvement Pertinent to Current Situation/Hospitalization: No - Comment as needed  Activities of Daily Living      Permission  Sought/Granted Permission sought to share information with : Family Supports Permission granted to share information with : Yes, Verbal Permission Granted  Share Information with NAME: Jettie Booze     Permission granted to share info w Relationship: daughter  Permission granted to share info w Contact Information: [phone]  Emotional Assessment Appearance:: Appears stated age Attitude/Demeanor/Rapport: Gracious, Engaged Affect (typically observed): Accepting, Pleasant Orientation: : Oriented to Self, Oriented to Place, Oriented to  Time, Oriented to Situation Alcohol / Substance Use: Not Applicable Psych Involvement: No (comment)  Admission diagnosis:  Closed left hip fracture (HCC) [S72.002A] Closed fracture of left hip, initial encounter ([place]) [S72.002A] Closed left hip fracture, initial encounter [facility]) [S72.002A] Patient Active Problem List   Diagnosis Date Noted  . Closed left hip fracture, initial encounter ([place]) 02/18/2020  . Chronic anemia 02/18/2020  . Closed left hip fracture ([place]) 02/18/2020  . Colitis 07/03/2017  . Atrophic vaginitis   . CP (cerebral palsy) ([place])   . Hypertension    PCP:  Harlan Stains, MD Pharmacy:   [id number] [place], [id number]: [phone]: [phone]    Social Determinants of Health (SDOH) Interventions    Readmission Risk Interventions Readmission Risk Prevention Plan 02/18/2020  Post Dischage Appt Complete  Medication Screening Complete  Transportation Screening Complete  Some recent data might be hidden

## 2020-02-18 NOTE — ED Provider Notes (Signed)
[facility]-EMERGENCY DEPT Provider Note   CSN: [id number] Arrival date & time: 02/18/20  0013     History Chief Complaint  Patient presents with  . Fall    Wendy Lewis is a 79 y.o. female.  Patient is a 79 year old female with past medical history of hypertension and cerebral palsy.  She is brought by EMS for evaluation of a fall.  Patient was attempting to get a frog out of her house when she fell in her kitchen and injured her left hip.  Patient unable to stand and walk.  She is complaining of severe pain to the left hip.  She denies any other injury.  The history is provided by the patient.  Fall This is a new problem. The current episode started less than 1 hour ago. The problem occurs constantly. The problem has not changed since onset.Associated symptoms comments: Hip pain. Exacerbated by: Movement, palpation, and change in position. Nothing relieves the symptoms. She has tried nothing for the symptoms.       Past Medical History:  Diagnosis Date  . Bursitis   . CP (cerebral palsy) (HCC)   . Hypertension   . Lichen sclerosus 05/2015   vulvar biopsy  . Osteopenia 10/2017   T score -1.5 FRAX 9.6% / 2.1%.  Stable from prior DEXA.    Patient Active Problem List   Diagnosis Date Noted  . Colitis 07/03/2017  . Atrophic vaginitis   . CP (cerebral palsy) (HCC)   . Hypertension     Past Surgical History:  Procedure Laterality Date  . ABDOMINAL HYSTERECTOMY  1975   TAH for cervical dysplasia  . CATARACT EXTRACTION     Bilateral  . TONSILLECTOMY       OB History    Gravida  2   Para  2   Term  2   Preterm      AB      Living  2     SAB      TAB      Ectopic      Multiple      Live Births              Family History  Problem Relation Age of Onset  . Diabetes Mother   . Hypertension Mother   . Heart disease Mother   . Cancer Sister        THYROID  . Cancer Paternal Grandmother        UTERINE OR OVARIAN?  Marland Kitchen  Hypertension Daughter     Social History   Tobacco Use  . Smoking status: Former Games developer  . Smokeless tobacco: Never Used  Vaping Use  . Vaping Use: Never used  Substance Use Topics  . Alcohol use: No    Alcohol/week: 0.0 standard drinks  . Drug use: No    Home Medications Prior to Admission medications   Medication Sig Start Date End Date Taking? Authorizing Provider  Cholecalciferol (VITAMIN D PO) Take 1 tablet by mouth daily.     [provider]  clobetasol cream (TEMOVATE) 0.05 % Apply 1 application topically 2 (two) times daily. 05/16/17   Fontaine, Nadyne Coombes, MD  lisinopril-hydrochlorothiazide (ZESTORETIC) 10-12.5 MG tablet Take 1 tablet by mouth daily.    [provider]  Multiple Vitamin (MULTIVITAMIN WITH MINERALS) TABS tablet Take 1 tablet by mouth daily.    [provider]    Allergies    Patient has no known allergies.  Review of Systems  Review of Systems  All other systems reviewed and are negative.   Physical Exam Updated Vital Signs BP (!) 147/70 (BP Location: Right Arm)   Pulse 92   Temp 97.7 F (36.5 C) (Oral)   Resp 16   SpO2 90%   Physical Exam Vitals and nursing note reviewed.  Constitutional:      General: She is not in acute distress.    Appearance: She is well-developed. She is not diaphoretic.  HENT:     Head: Normocephalic and atraumatic.  Cardiovascular:     Rate and Rhythm: Normal rate and regular rhythm.     Heart sounds: No murmur heard.  No friction rub. No gallop.   Pulmonary:     Effort: Pulmonary effort is normal. No respiratory distress.     Breath sounds: Normal breath sounds. No wheezing.  Abdominal:     General: Bowel sounds are normal. There is no distension.     Palpations: Abdomen is soft.     Tenderness: There is no abdominal tenderness.  Musculoskeletal:        General: Normal range of motion.     Cervical back: Normal range of motion and neck supple.     Comments: There is shortening  and external rotation of the left leg.  There is tenderness over the lateral aspect of the left hip.  DP pulses are easily palpable bilaterally and motor and sensation are intact to both feet.  Skin:    General: Skin is warm and dry.  Neurological:     Mental Status: She is alert and oriented to person, place, and time.     Comments: Patient is awake and alert, but does appear tremulous.     ED Results / Procedures / Treatments   Labs (all labs ordered are listed, but only abnormal results are displayed) Labs Reviewed  COMPREHENSIVE METABOLIC PANEL  CBC WITH DIFFERENTIAL/PLATELET  PROTIME-INR    EKG EKG Interpretation  Date/Time:  Monday February 18 2020 00:22:08 EDT Ventricular Rate:  93 PR Interval:    QRS Duration: 112 QT Interval:  377 QTC Calculation: 469 R Axis:   21 Text Interpretation: Sinus rhythm Borderline intraventricular conduction delay Low voltage, extremity and precordial leads Artifact in lead(s) I III aVR aVL aVF V1 V4 V5 V6 No significant change since 07/03/2017 Confirmed by Geoffery Lyons ([id number]) on 02/18/2020 12:57:47 AM   Radiology No results found.  Procedures Procedures (including critical care time)  Medications Ordered in ED Medications  ondansetron (ZOFRAN) injection 4 mg (has no administration in time range)    ED Course  I have reviewed the triage vital signs and the nursing notes.  Pertinent labs & imaging results that were available during my care of the patient were reviewed by me and considered in my medical decision making (see chart for details).    MDM Rules/Calculators/A&P  Patient presenting here after a fall at home. She has a left femoral neck fracture. This was discussed with Dr. Charlann Boxer. He would like the patient to remain n.p.o. so that he could assess the situation and scheduling in the morning. Patient has no other injuries. Laboratory studies thus far are unremarkable. Patient to be admitted to the hospitalist service under the  care of Dr. Toniann Fail.  Final Clinical Impression(s) / ED Diagnoses Final diagnoses:  None    Rx / DC Orders ED Discharge Orders    None       Geoffery Lyons, MD 02/18/20 0147

## 2020-02-19 ENCOUNTER — Encounter (HOSPITAL_COMMUNITY): Payer: Self-pay | Admitting: Orthopedic Surgery

## 2020-02-19 DIAGNOSIS — I10 Essential (primary) hypertension: Secondary | ICD-10-CM

## 2020-02-19 DIAGNOSIS — D649 Anemia, unspecified: Secondary | ICD-10-CM

## 2020-02-19 DIAGNOSIS — Z96642 Presence of left artificial hip joint: Secondary | ICD-10-CM

## 2020-02-19 DIAGNOSIS — G809 Cerebral palsy, unspecified: Secondary | ICD-10-CM

## 2020-02-19 DIAGNOSIS — D696 Thrombocytopenia, unspecified: Secondary | ICD-10-CM

## 2020-02-19 DIAGNOSIS — E871 Hypo-osmolality and hyponatremia: Secondary | ICD-10-CM

## 2020-02-19 LAB — CBC
HCT: 26.5 % — ABNORMAL LOW (ref 36.0–46.0)
Hemoglobin: 9 g/dL — ABNORMAL LOW (ref 12.0–15.0)
MCH: 31.8 pg (ref 26.0–34.0)
MCHC: 34 g/dL (ref 30.0–36.0)
MCV: 93.6 fL (ref 80.0–100.0)
Platelets: 146 10*3/uL — ABNORMAL LOW (ref 150–400)
RBC: 2.83 MIL/uL — ABNORMAL LOW (ref 3.87–5.11)
RDW: 12.7 % (ref 11.5–15.5)
WBC: 9.9 10*3/uL (ref 4.0–10.5)
nRBC: 0 % (ref 0.0–0.2)

## 2020-02-19 LAB — BASIC METABOLIC PANEL
Anion gap: 9 (ref 5–15)
BUN: 13 mg/dL (ref 8–23)
CO2: 24 mmol/L (ref 22–32)
Calcium: 7.9 mg/dL — ABNORMAL LOW (ref 8.9–10.3)
Chloride: 98 mmol/L (ref 98–111)
Creatinine, Ser: 1.12 mg/dL — ABNORMAL HIGH (ref 0.44–1.00)
GFR calc Af Amer: 54 mL/min — ABNORMAL LOW (ref >60)
GFR calc non Af Amer: 47 mL/min — ABNORMAL LOW (ref >60)
Glucose, Bld: 139 mg/dL — ABNORMAL HIGH (ref 70–99)
Potassium: 3.9 mmol/L (ref 3.5–5.1)
Sodium: 131 mmol/L — ABNORMAL LOW (ref 135–145)

## 2020-02-19 LAB — HEMOGLOBIN A1C
Hgb A1c MFr Bld: 6 % — ABNORMAL HIGH (ref 4.8–5.6)
Mean Plasma Glucose: 125.5 mg/dL

## 2020-02-19 NOTE — Progress Notes (Signed)
   Subjective: 1 Day Post-Op Procedure(s) (LRB): TOTAL HIP ARTHROPLASTY ANTERIOR APPROACH (Left) Patient reports pain as mild.   Patient seen in rounds for Dr. Charlann Boxer. Patient is well, and has had no acute complaints or problems other than mild discomfort in the left hip. She reports her pain is much improved since prior to surgery. She is resting comfortably in bed with her daughter at the bedside this morning. No acute events overnight. Voiding without difficulty.  We will start therapy today.   Objective: Vital signs in last 24 hours: Temp:  [97.8 F (36.6 C)-99.9 F (37.7 C)] 98.6 F (37 C) (10/05 1004) Pulse Rate:  [69-107] 107 (10/05 1004) Resp:  [12-18] 16 (10/05 1004) BP: (99-155)/(50-93) 121/69 (10/05 1004) SpO2:  [93 %-100 %] 93 % (10/05 1004)  Intake/Output from previous day:  Intake/Output Summary (Last 24 hours) at 02/19/2020 1211 Last data filed at 02/19/2020 1005 Gross per 24 hour  Intake 2799.37 ml  Output 1275 ml  Net 1524.37 ml     Intake/Output this shift: Total I/O In: 405.7 [P.O.:100; I.V.:305.7] Out: 200 [Urine:200]  Labs: Recent Labs    02/18/20 0041 02/18/20 1308 02/19/20 0345  HGB 11.6* 11.8* 9.0*   Recent Labs    02/18/20 0041 02/18/20 0041 02/18/20 1308 02/19/20 0345  WBC 12.5*  --   --  9.9  RBC 3.65*  --   --  2.83*  HCT 33.5*   < > 34.3* 26.5*  PLT 209  --   --  146*   < > = values in this interval not displayed.   Recent Labs    02/18/20 1308 02/19/20 0345  NA 133* 131*  K 4.7 3.9  CL 97* 98  CO2 27 24  BUN 15 13  CREATININE 1.16* 1.12*  GLUCOSE 118* 139*  CALCIUM 8.8* 7.9*   Recent Labs    02/18/20 0041  INR 1.0    Exam: General - Patient is Alert and Oriented Extremity - Neurologically intact Sensation intact distally Intact pulses distally Dorsiflexion/Plantar flexion intact Dressing - dressing C/D/I Motor Function - intact, moving foot and toes well on exam.   Past Medical History:  Diagnosis Date  .  Bursitis   . CP (cerebral palsy) (HCC)   . Hypertension   . Lichen sclerosus 05/2015   vulvar biopsy  . Osteopenia 10/2017   T score -1.5 FRAX 9.6% / 2.1%.  Stable from prior DEXA.    Assessment/Plan: 1 Day Post-Op Procedure(s) (LRB): TOTAL HIP ARTHROPLASTY ANTERIOR APPROACH (Left) Principal Problem:   Closed left hip fracture, initial encounter (HCC) Active Problems:   CP (cerebral palsy) (HCC)   Hypertension   Chronic anemia   Closed left hip fracture (HCC)   S/P left THA, AA  Estimated body mass index is 20.6 kg/m as calculated from the following:   Height as of this encounter: 4\' 11"  (1.499 m).   Weight as of this encounter: 46.3 kg. Advance diet Up with therapy  DVT Prophylaxis - Aspirin Weight bearing as tolerated.  Plan is to go Home with her daughter after hospital stay. Plan to get up with PT today.   , PA-C Orthopedic Surgery [phone] 02/19/2020, 12:11 PM

## 2020-02-19 NOTE — Progress Notes (Signed)
PROGRESS NOTE  Wendy Lewis VHQ:[id number] DOB: Sep 23, 1940   PCP: Laurann MontanaWhite, Cynthia, MD  Patient is from: Home. Lives alone independently.  DOA: 02/18/2020 LOS: 1  Brief Narrative / Interim history: 79 year old female with history of cerebral palsy, HTN and anemia presenting with left hip pain after accidental fall in an attempt to avoid frog in her kitchen. No head trauma or loss of consciousness.  In ED, hemodynamically stable. Na 133. K3.4. Cr 1.19. WBC 12.5. Hgb 11.6. Influenza and COVID-19 PCR negative. CXR without acute finding. Hip x-ray with mildly displaced left transcervical femoral neck fracture. Orthopedic surgery consulted.   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  Subjective: Seen and examined earlier this morning.  No major events overnight of this morning.  Pain fairly controlled.  No complaints.  Patient's daughter at bedside.  Noted hemoglobin drop by about 3 g.   Objective: Vitals:   02/18/20 2201 02/19/20 0154 02/19/20 0526 02/19/20 1004  BP: (!) 131/50 (!) 155/63 (!) 134/51 121/69  Pulse: 82 79 88 (!) 107  Resp: 16 16 16 16   Temp: 98.3 F (36.8 C) 98.4 F (36.9 C) 98 F (36.7 C) 98.6 F (37 C)  TempSrc: Oral  Oral Oral  SpO2: 97% 97% 98% 93%  Weight:      Height:        Intake/Output Summary (Last 24 hours) at 02/19/2020 1216 Last data filed at 02/19/2020 1005 Gross per 24 hour  Intake 2799.37 ml  Output 1275 ml  Net 1524.37 ml   Filed Weights   02/18/20 0716  Weight: 46.3 kg    Examination:  GENERAL: No apparent distress.  Nontoxic. HEENT: MMM.  Vision and hearing grossly intact.  NECK: Supple.  No apparent JVD.  RESP:  No IWOB.  Fair aeration bilaterally. CVS:  RRR. Heart sounds normal.  ABD/GI/GU: BS+. Abd soft, NTND.  MSK/EXT: No apparent deformity.  Limited motion in left leg.  Coarse tremor in LUE from cerebral palsy SKIN: Dressing over left thigh DCI. NEURO: Awake, alert and oriented appropriately. PSYCH: Calm.  Normal affect.   Procedures:  10/4-left total hip replacement by Dr. Charlann Boxerlin  Microbiology summarized: COVID-19 PCR negative. Influenza PCR negative.  Assessment & Plan: Accidental fall at home Left femoral neck fracture-mildly displaced left transcervical femoral neck fracture on x-ray. -S/p Left total hip replacement by Dr.Olin  -Pain control and VTE prophylaxis per surgery -Discontinue IV fluid -PT/OT eval  Essential hypertension: Normotensive. -As needed hydralazine  Hyponatremia/hypokalemia: Hypokalemia resolved. Na 131.  Likely due to lisinopril/HCTZ -Continue holding lisinopril/HCTZ. -As needed hydralazine for blood pressure -Recheck labs in the morning  Normocytic anemia: Hgb 9.0, dropped about 3 g, likely hemodilution with some degree of blood loss.  No signs of bleeding at surgical site -Discontinue IV fluid -Continue monitoring.  Thrombocytopenia: Dilutional. -Recheck in the morning  History of cerebral palsy: Independent at baseline. -PT/OT  Body mass index is 20.6 kg/m. Nutrition Problem: Increased nutrient needs Etiology: hip fracture, post-op healing Signs/Symptoms: estimated needs Interventions: Ensure Enlive (each supplement provides 350kcal and 20 grams of protein), MVI   DVT prophylaxis:  SCDs Start: 02/18/20 1950 Place TED hose Start: 02/18/20 1950 SCDs Start: 02/18/20 0316  Code Status: Full code Family Communication: Updated patient's daughter at bedside. Status is: Inpatient  Remains inpatient appropriate because:Unsafe d/c plan and Inpatient level of care appropriate due to severity of illness   Dispo: The patient is from: Home  Anticipated d/c is to: Home with daughter once cleared by orthopedic surgery              Anticipated d/c date is: 2 days              Patient currently is not medically stable to d/c.       Consultants:  Orthopedic surgery   Sch Meds:  Scheduled Meds: . aspirin  81 mg Oral BID  . docusate  sodium  100 mg Oral BID  . feeding supplement (ENSURE ENLIVE)  237 mL Oral BID BM  . ferrous sulfate  325 mg Oral TID PC  . multivitamin with minerals  1 tablet Oral Daily  . polyethylene glycol  17 g Oral BID   Continuous Infusions: . sodium chloride 100 mL/hr at 02/19/20 0200  . 0.9 % NaCl with KCl 20 mEq / L 50 mL/hr at 02/18/20 1146  . methocarbamol (ROBAXIN) IV     PRN Meds:.acetaminophen, alum & mag hydroxide-simeth, bisacodyl, diphenhydrAMINE, hydrALAZINE, HYDROcodone-acetaminophen, HYDROcodone-acetaminophen, magnesium citrate, menthol-cetylpyridinium **OR** phenol, methocarbamol **OR** methocarbamol (ROBAXIN) IV, metoCLOPramide **OR** metoCLOPramide (REGLAN) injection, morphine injection, ondansetron **OR** ondansetron (ZOFRAN) IV  Antimicrobials: Anti-infectives (From admission, onward)   Start     Dose/Rate Route Frequency Ordered Stop   02/18/20 2000  ceFAZolin (ANCEF) IVPB 1 g/50 mL premix        1 g 100 mL/hr over 30 Minutes Intravenous Every 6 hours 02/18/20 1950 02/19/20 0724   02/18/20 1445  ceFAZolin (ANCEF) IVPB 2g/100 mL premix        2 g 200 mL/hr over 30 Minutes Intravenous On call to O.R. 02/18/20 1441 02/18/20 1712       I have personally reviewed the following labs and images: CBC: Recent Labs  Lab 02/18/20 0041 02/18/20 1308 02/19/20 0345  WBC 12.5*  --  9.9  NEUTROABS 9.6*  --   --   HGB 11.6* 11.8* 9.0*  HCT 33.5* 34.3* 26.5*  MCV 91.8  --  93.6  PLT 209  --  146*   BMP &GFR Recent Labs  Lab 02/18/20 0041 02/18/20 1308 02/19/20 0345  NA 133* 133* 131*  K 3.4* 4.7 3.9  CL 97* 97* 98  CO2 25 27 24   GLUCOSE 158* 118* 139*  BUN 16 15 13   CREATININE 1.19* 1.16* 1.12*  CALCIUM 8.8* 8.8* 7.9*  MG  --  2.0  --   PHOS  --  3.0  --    Estimated Creatinine Clearance: 28.2 mL/min (A) (by C-G formula based on SCr of 1.12 mg/dL (H)). Liver & Pancreas: Recent Labs  Lab 02/18/20 0041 02/18/20 1308  AST 32  --   ALT 19  --   ALKPHOS 65  --     BILITOT 0.6  --   PROT 6.6  --   ALBUMIN 4.0 4.0   No results for input(s): LIPASE, AMYLASE in the last 168 hours. No results for input(s): AMMONIA in the last 168 hours. Diabetic: Recent Labs    02/18/20 0041 02/19/20 0345  HGBA1C 5.9* 6.0*   No results for input(s): GLUCAP in the last 168 hours. Cardiac Enzymes: Recent Labs  Lab 02/18/20 1308  CKTOTAL 298*   No results for input(s): PROBNP in the last 8760 hours. Coagulation Profile: Recent Labs  Lab 02/18/20 0041  INR 1.0   Thyroid Function Tests: No results for input(s): TSH, T4TOTAL, FREET4, T3FREE, THYROIDAB in the last 72 hours. Lipid Profile: No results for input(s): CHOL, HDL, LDLCALC, TRIG, CHOLHDL, LDLDIRECT  in the last 72 hours. Anemia Panel: No results for input(s): VITAMINB12, FOLATE, FERRITIN, TIBC, IRON, RETICCTPCT in the last 72 hours. Urine analysis:    Component Value Date/Time   COLORURINE YELLOW 07/03/2017 1145   APPEARANCEUR CLEAR 07/03/2017 1145   LABSPEC 1.020 07/03/2017 1145   PHURINE 5.0 07/03/2017 1145   GLUCOSEU NEGATIVE 07/03/2017 1145   HGBUR NEGATIVE 07/03/2017 1145   BILIRUBINUR NEGATIVE 07/03/2017 1145   KETONESUR 5 (A) 07/03/2017 1145   PROTEINUR NEGATIVE 07/03/2017 1145   NITRITE NEGATIVE 07/03/2017 1145   LEUKOCYTESUR MODERATE (A) 07/03/2017 1145   Sepsis Labs: Invalid input(s): PROCALCITONIN, LACTICIDVEN  Microbiology: Recent Results (from the past 240 hour(s))  Respiratory Panel by RT PCR (Flu A&B, Covid) - Nasopharyngeal Swab     Status: None   Collection Time: 02/18/20  1:02 AM   Specimen: Nasopharyngeal Swab  Result Value Ref Range Status   SARS Coronavirus 2 by RT PCR NEGATIVE NEGATIVE Final    Comment: (NOTE) SARS-CoV-2 target nucleic acids are NOT DETECTED.  The SARS-CoV-2 RNA is generally detectable in upper respiratoy specimens during the acute phase of infection. The lowest concentration of SARS-CoV-2 viral copies this assay can detect is 131 copies/mL. A  negative result does not preclude SARS-Cov-2 infection and should not be used as the sole basis for treatment or other patient management decisions. A negative result may occur with  improper specimen collection/handling, submission of specimen other than nasopharyngeal swab, presence of viral mutation(s) within the areas targeted by this assay, and inadequate number of viral copies (<131 copies/mL). A negative result must be combined with clinical observations, patient history, and epidemiological information. The expected result is Negative.  Fact Sheet for Patients:  https://www.moore.com/  Fact Sheet for Healthcare Providers:  https://www.young.biz/  This test is no t yet approved or cleared by the [place] FDA and  has been authorized for detection and/or diagnosis of SARS-CoV-2 by FDA under an Emergency Use Authorization (EUA). This EUA will remain  in effect (meaning this test can be used) for the duration of the COVID-19 declaration under Section 564(b)(1) of the Act, 21 U.S.C. section 360bbb-3(b)(1), unless the authorization is terminated or revoked sooner.     Influenza A by PCR NEGATIVE NEGATIVE Final   Influenza B by PCR NEGATIVE NEGATIVE Final    Comment: (NOTE) The Xpert Xpress SARS-CoV-2/FLU/RSV assay is intended as an aid in  the diagnosis of influenza from Nasopharyngeal swab specimens and  should not be used as a sole basis for treatment. Nasal washings and  aspirates are unacceptable for Xpert Xpress SARS-CoV-2/FLU/RSV  testing.  Fact Sheet for Patients: https://www.moore.com/  Fact Sheet for Healthcare Providers: https://www.young.biz/  This test is not yet approved or cleared by the [place] FDA and  has been authorized for detection and/or diagnosis of SARS-CoV-2 by  FDA under an Emergency Use Authorization (EUA). This EUA will remain  in effect (meaning this test can  be used) for the duration of the  Covid-19 declaration under Section 564(b)(1) of the Act, 21  U.S.C. section 360bbb-3(b)(1), unless the authorization is  terminated or revoked. Performed at [facility], [address] [place], [place] [address]     Radiology Studies: DG Pelvis Portable  Result Date: 02/18/2020 CLINICAL DATA:  Status post left hip replacement EXAM: PORTABLE PELVIS 1-2 VIEWS COMPARISON:  Intraoperative films from earlier in the same day. FINDINGS: Left hip prosthesis is now seen. No other acute bony abnormality is noted. Degenerative changes of lumbar spine are  seen. No soft tissue abnormality is seen. IMPRESSION: Status post left hip replacement Electronically Signed   By: Alcide Clever M.D.   On: 02/18/2020 19:14   DG C-Arm 1-60 Min-No Report  Result Date: 02/18/2020 CLINICAL DATA:  ORIF left hip fracture. EXAM: OPERATIVE LEFT HIP (WITH PELVIS IF PERFORMED) TECHNIQUE: Fluoroscopic spot image(s) were submitted for interpretation post-operatively. COMPARISON:  Preoperative radiograph earlier this day. FINDINGS: Three fluoroscopic spot views obtained in the operating room. Left femoral neck fracture. Subsequent acetabular cup, the femoral head component is not visualized on these provided fluoroscopic views. Total fluoroscopy time 18 seconds. IMPRESSION: Fluoroscopic spot views during left hip arthroplasty. Electronically Signed   By: Narda Rutherford M.D.   On: 02/18/2020 19:24   DG HIP OPERATIVE UNILAT W OR W/O PELVIS LEFT  Result Date: 02/18/2020 CLINICAL DATA:  ORIF left hip fracture. EXAM: OPERATIVE LEFT HIP (WITH PELVIS IF PERFORMED) TECHNIQUE: Fluoroscopic spot image(s) were submitted for interpretation post-operatively. COMPARISON:  Preoperative radiograph earlier this day. FINDINGS: Three fluoroscopic spot views obtained in the operating room. Left femoral neck fracture. Subsequent acetabular cup, the femoral head component is not visualized on these  provided fluoroscopic views. Total fluoroscopy time 18 seconds. IMPRESSION: Fluoroscopic spot views during left hip arthroplasty. Electronically Signed   By: Narda Rutherford M.D.   On: 02/18/2020 19:24     Andruw Battie T. Corbitt [facility]  If 7PM-7AM, please contact night-coverage [email or web address] 02/19/2020, 12:16 PM

## 2020-02-19 NOTE — Evaluation (Signed)
Physical Therapy Evaluation Patient Details Name: Wendy Lewis MRN: [id number] DOB: 1941-01-18 Today's Date: 02/19/2020   History of Present Illness  79 y.o. female with history of hypertension anemia and cerebral palsy.  She had a fall at home when she was trying to avoid a frog in the kitchen.  xray= L hip fx. pt is s/p R DA THA per Dr Charlann Boxer on 02/18/20  Clinical Impression  Pt is s/p THA resulting in the deficits listed below (see PT Problem List).  Pt will benefit from skilled PT to increase their independence and safety with mobility to allow discharge to the venue listed below.   Pt is very independent at her baseline. amb ~ 33' with RW and min assist Planning to go to dtr's home at d/c. anticipate steady progress in acute setting. Would benefit from HHPT Will continue to follow   Follow Up Recommendations Home health PT;Follow surgeon's recommendation for DC plan and follow-up therapies    Equipment Recommendations  Rolling walker with 5" wheels;3in1 (PT) (youth ht RW)    Recommendations for Other Services       Precautions / Restrictions Precautions Precautions: Fall Restrictions Weight Bearing Restrictions: No      Mobility  Bed Mobility Overal bed mobility: Needs Assistance Bed Mobility: Supine to Sit     Supine to sit: Min assist     General bed mobility comments: assist with LLE  Transfers Overall transfer level: Needs assistance Equipment used: Rolling walker (2 wheeled) Transfers: Sit to/from Stand Sit to Stand: Min assist         General transfer comment: assist to rise and stabilize, cues for hand placement and safety  Ambulation/Gait Ambulation/Gait assistance: Min assist Gait Distance (Feet): 42 Feet Assistive device: Rolling walker (2 wheeled) Gait Pattern/deviations: Step-to pattern;Decreased dorsiflexion - left;Decreased stance time - left     General Gait Details: cues for sequence and RW position. able to begin step through end of  distance  Stairs            Wheelchair Mobility    Modified Rankin (Stroke Patients Only)       Balance                                             Pertinent Vitals/Pain Pain Assessment: 0-10 Pain Score: 4  Pain Location: left hip Pain Descriptors / Indicators: Aching;Sore Pain Intervention(s): Premedicated before session;Monitored during session;Limited activity within patient's tolerance    Home Living Family/patient expects to be discharged to:: Private residence Living Arrangements: Alone Available Help at Discharge: Family;Available 24 hours/day Type of Home: House Home Access: Stairs to enter Entrance Stairs-Rails: Right Entrance Stairs-Number of Steps: 2-3   Home Equipment: None      Prior Function Level of Independence: Independent               Hand Dominance        Extremity/Trunk Assessment   Upper Extremity Assessment Upper Extremity Assessment: Overall WFL for tasks assessed    Lower Extremity Assessment Lower Extremity Assessment: LLE deficits/detail       Communication   Communication: No difficulties  Cognition Arousal/Alertness: Awake/alert Behavior During Therapy: WFL for tasks assessed/performed Overall Cognitive Status: Within Functional Limits for tasks assessed  General Comments      Exercises     Assessment/Plan    PT Assessment Patient needs continued PT services  PT Problem List Decreased strength;Decreased range of motion;Decreased activity tolerance;Decreased knowledge of use of DME;Pain;Decreased mobility       PT Treatment Interventions DME instruction;Therapeutic activities;Gait training;Functional mobility training;Therapeutic exercise;Patient/family education;Balance training;Stair training    PT Goals (Current goals can be found in the Care Plan section)  Acute Rehab PT Goals Patient Stated Goal: back to IND PT Goal  Formulation: With patient Time For Goal Achievement: 03/04/20 Potential to Achieve Goals: Good    Frequency 7X/week   Barriers to discharge        Co-evaluation               AM-PAC PT "6 Clicks" Mobility  Outcome Measure Help needed turning from your back to your side while in a flat bed without using bedrails?: A Little Help needed moving from lying on your back to sitting on the side of a flat bed without using bedrails?: A Little Help needed moving to and from a bed to a chair (including a wheelchair)?: A Little Help needed standing up from a chair using your arms (e.g., wheelchair or bedside chair)?: A Little Help needed to walk in hospital room?: A Little Help needed climbing 3-5 steps with a railing? : A Little 6 Click Score: 18    End of Session Equipment Utilized During Treatment: Gait belt Activity Tolerance: Patient tolerated treatment well Patient left: in chair;with call bell/phone within reach;with chair alarm set;with family/visitor present Nurse Communication: Mobility status PT Visit Diagnosis: Difficulty in walking, not elsewhere classified (R26.2)    Time: 2330-0762 PT Time Calculation (min) (ACUTE ONLY): 27 min   Charges:   PT Evaluation $PT Eval Low Complexity: 1 Low PT Treatments $Gait Training: 8-22 mins        Delice Bison, PT  Acute Rehab Dept ([facility]) [phone] Pager [phone]  02/19/2020   [facility] 02/19/2020, 3:37 PM

## 2020-02-19 NOTE — Progress Notes (Signed)
   02/19/20 1540  PT Visit Information  Last PT Received On 02/19/20  Exercise focused session. Pt tol well. Continue PT POC  Assistance Needed +1  History of Present Illness 79 y.o. female with history of hypertension anemia and cerebral palsy.  She had a fall at home when she was trying to avoid a frog in the kitchen.  xray= L hip fx. pt is s/p R DA THA per Dr Charlann Boxer on 02/18/20  Subjective Data  Patient Stated Goal back to IND  Precautions  Precautions Fall  Restrictions  Weight Bearing Restrictions No  Other Position/Activity Restrictions WBAT  Pain Assessment  Pain Assessment 0-10  Pain Score 3  Pain Location left hip  Pain Descriptors / Indicators Aching;Sore  Pain Intervention(s) Limited activity within patient's tolerance;Monitored during session;Premedicated before session  Cognition  Arousal/Alertness Awake/alert  Behavior During Therapy [facility] for tasks assessed/performed  Overall Cognitive Status Within Functional Limits for tasks assessed  Total Joint Exercises  Ankle Circles/Pumps AROM;Both;15 reps  Quad Sets AROM;Both;10 reps  Heel Slides AROM;15 reps;Left  Hip ABduction/ADduction AAROM;Left;15 reps  PT - End of Session  Equipment Utilized During Treatment Gait belt  Activity Tolerance Patient tolerated treatment well  Patient left in bed;with call bell/phone within reach;with bed alarm set  Nurse Communication Mobility status   PT - Assessment/Plan  PT Plan Current plan remains appropriate  PT Visit Diagnosis Difficulty in walking, not elsewhere classified (R26.2)  PT Frequency (ACUTE ONLY) 7X/week  Follow Up Recommendations Home health PT;Follow surgeon's recommendation for DC plan and follow-up therapies  PT equipment Rolling walker with 5" wheels;3in1 (PT)  AM-PAC PT "6 Clicks" Mobility Outcome Measure (Version 2)  Help needed turning from your back to your side while in a flat bed without using bedrails? 3  Help needed moving from lying on your back to sitting  on the side of a flat bed without using bedrails? 3  Help needed moving to and from a bed to a chair (including a wheelchair)? 3  Help needed standing up from a chair using your arms (e.g., wheelchair or bedside chair)? 3  Help needed to walk in hospital room? 3  Help needed climbing 3-5 steps with a railing?  3  6 Click Score 18  Consider Recommendation of Discharge To: Home with [facility]  PT Goal Progression  Progress towards PT goals Progressing toward goals  Acute Rehab PT Goals  PT Goal Formulation With patient  Time For Goal Achievement 03/04/20  Potential to Achieve Goals Good  PT Time Calculation  PT Start Time (ACUTE ONLY) 1528  PT Stop Time (ACUTE ONLY) 1543  PT Time Calculation (min) (ACUTE ONLY) 15 min  PT General Charges  $$ ACUTE PT VISIT 1 Visit  PT Treatments  $Therapeutic Exercise 8-22 mins

## 2020-02-19 NOTE — Plan of Care (Signed)
  Problem: Education: Goal: Knowledge of General Education information will improve Description Including pain rating scale, medication(s)/side effects and non-pharmacologic comfort measures Outcome: Progressing   

## 2020-02-20 ENCOUNTER — Other Ambulatory Visit: Payer: Self-pay

## 2020-02-20 DIAGNOSIS — S72002A Fracture of unspecified part of neck of left femur, initial encounter for closed fracture: Secondary | ICD-10-CM

## 2020-02-20 LAB — CBC
HCT: 24.9 % — ABNORMAL LOW (ref 36.0–46.0)
Hemoglobin: 8.2 g/dL — ABNORMAL LOW (ref 12.0–15.0)
MCH: 31.1 pg (ref 26.0–34.0)
MCHC: 32.9 g/dL (ref 30.0–36.0)
MCV: 94.3 fL (ref 80.0–100.0)
Platelets: 149 10*3/uL — ABNORMAL LOW (ref 150–400)
RBC: 2.64 MIL/uL — ABNORMAL LOW (ref 3.87–5.11)
RDW: 13.2 % (ref 11.5–15.5)
WBC: 13.4 10*3/uL — ABNORMAL HIGH (ref 4.0–10.5)
nRBC: 0 % (ref 0.0–0.2)

## 2020-02-20 LAB — BASIC METABOLIC PANEL
Anion gap: 6 (ref 5–15)
BUN: 18 mg/dL (ref 8–23)
CO2: 29 mmol/L (ref 22–32)
Calcium: 8.2 mg/dL — ABNORMAL LOW (ref 8.9–10.3)
Chloride: 99 mmol/L (ref 98–111)
Creatinine, Ser: 1.01 mg/dL — ABNORMAL HIGH (ref 0.44–1.00)
GFR calc non Af Amer: 53 mL/min — ABNORMAL LOW (ref >60)
Glucose, Bld: 120 mg/dL — ABNORMAL HIGH (ref 70–99)
Potassium: 4.1 mmol/L (ref 3.5–5.1)
Sodium: 134 mmol/L — ABNORMAL LOW (ref 135–145)

## 2020-02-20 LAB — RENAL FUNCTION PANEL
Albumin: 2.9 g/dL — ABNORMAL LOW (ref 3.5–5.0)
Anion gap: 6 (ref 5–15)
BUN: 19 mg/dL (ref 8–23)
CO2: 29 mmol/L (ref 22–32)
Calcium: 8.1 mg/dL — ABNORMAL LOW (ref 8.9–10.3)
Chloride: 100 mmol/L (ref 98–111)
Creatinine, Ser: 1 mg/dL (ref 0.44–1.00)
GFR calc non Af Amer: 54 mL/min — ABNORMAL LOW (ref >60)
Glucose, Bld: 119 mg/dL — ABNORMAL HIGH (ref 70–99)
Phosphorus: 1.7 mg/dL — ABNORMAL LOW (ref 2.5–4.6)
Potassium: 4.1 mmol/L (ref 3.5–5.1)
Sodium: 135 mmol/L (ref 135–145)

## 2020-02-20 LAB — MAGNESIUM: Magnesium: 1.9 mg/dL (ref 1.7–2.4)

## 2020-02-20 MED ORDER — FERROUS SULFATE 325 (65 FE) MG PO TABS: 325.0000 mg | ORAL_TABLET | Freq: Three times a day (TID) | ORAL | 0 refills | Status: DC

## 2020-02-20 MED ORDER — HYDROCODONE-ACETAMINOPHEN 5-325 MG PO TABS: 1.0000 | ORAL_TABLET | Freq: Four times a day (QID) | ORAL | 0 refills | Status: DC | PRN

## 2020-02-20 MED ORDER — ASPIRIN 81 MG PO CHEW: 81.0000 mg | CHEWABLE_TABLET | Freq: Two times a day (BID) | ORAL | 0 refills | Status: AC

## 2020-02-20 MED ORDER — POTASSIUM & SODIUM PHOSPHATES 280-160-250 MG PO PACK
1.0000 | PACK | Freq: Three times a day (TID) | ORAL | Status: DC
  Administered 2020-02-20 – 2020-02-21 (×5): 1 via ORAL
  Filled 2020-02-20 (×6): qty 1

## 2020-02-20 MED ORDER — METHOCARBAMOL 500 MG PO TABS: 500.0000 mg | ORAL_TABLET | Freq: Four times a day (QID) | ORAL | 0 refills | Status: DC | PRN

## 2020-02-20 NOTE — Discharge Summary (Signed)
Physician Discharge Summary  Wendy Lewis DZH:[id number] DOB: [birth year] DOA: 02/18/2020  PCP: Wendy Montana, MD  Admit date: 02/18/2020 Discharge date: 02/21/2020  Admitted From:Home Disposition:Home-[facility].  Recommendations for Outpatient Follow-up:  Follow up with PCP in 1-2 weeks Please obtain BMP/CBC, phosphorus level in one week Please follow up on the following pending results:  Home Health:[facility] Equipment/Devices:  Discharge Condition:Stable Code Status:   Code Status: Full Code Diet recommendation:  Diet Order             Diet - low sodium heart healthy           Diet regular Room service appropriate? Yes; Fluid consistency: Thin  Diet effective now                  Brief/Interim Summary: 79 year old female with history of cerebral palsy, HTN and anemia presenting with left hip pain after accidental fall in an attempt to avoid frog in her kitchen. No head trauma or loss of consciousness.   In ED, hemodynamically stable. Na 133. K3.4. Cr 1.19. WBC 12.5. Hgb 11.6. Influenza and COVID-19 PCR negative. CXR without acute finding. Hip x-ray with mildly displaced left transcervical femoral neck fracture. Orthopedic surgery consulted.  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 On ambulation today somewhat Dizzy on walking with PT but orthostatic vital signs negative,blood pressure actually up while working with PT likely from pain.  Discussed with [facility] offered to keep her 1 more day in the [facility] but at this time patient and the patient's [facility] would like to go home.  She will be discharged and [facility] will be monitoring her closely and she will follow up with her PCP and ortho.  Discharge Diagnoses:  Left femoral neck fracture mildly displaced from fall: status post left total hip replacement by Dr. Charlann Lewis.  Postop doing well, on aspirin twice daily for 4 weeks for DVT prophylaxis and also pain medication for lipids and muscle relaxants  prescribed by Ortho.  Patient with limited mobility working with PT. patient family desires to go home and not SNF at this time and home health has been set up    Hypophosphatemia-we will keep on oral phosphate supplementation for couple days and advised follow-up with PCP.   Hyponatremia/Hypokalemia: Resolved. Normocytic anemia: Mild drop likely postop anemia from blood loss.  Stable at 8.8 g. Cont on iron supplementation for discharge, follow-up with PCP with CBC in 1 week. Thrombocytopenia: Stable.  Cerebral palsy history, at baseline and independent prior to fall Hypertension: BP well controlled.  Home HCTZ lisinopril are hold  Consults: ortho  Subjective: Alert awake.  Resting comfortably. Going home today, [facility] coming to pick him up today.  Discharge Exam: Vitals:   02/21/20 1029 02/21/20 1047  BP: (!) 162/95 118/81  Pulse: (!) 114 85  Resp:  16  Temp:    SpO2: 96% 93%   General: Pt is alert, awake, not in acute distress Cardiovascular: RRR, S1/S2 +, no rubs, no gallops Respiratory: CTA bilaterally, no wheezing, no rhonchi Abdominal: Soft, NT, ND, bowel sounds + Extremities: no edema, no cyanosis Surgical site intact dressing.  Discharge Instructions  Discharge Instructions     Call MD / Call 911   Complete by: As directed    If you experience chest pain or shortness of breath, CALL 911 and be transported to the [facility] emergency room.  If you develope a fever above 101 F, pus (white drainage) or increased drainage or redness at the wound, or calf  pain, call your surgeon's office.   Change dressing   Complete by: As directed    Maintain surgical dressing until follow up in the clinic. If the edges start to pull up, may reinforce with tape. If the dressing is no longer working, may remove and cover with gauze and tape, but must keep the area dry and clean.  Call with any questions or concerns.   Constipation Prevention   Complete by: As directed    Drink plenty  of fluids.  Prune juice may be helpful.  You may use a stool softener, such as Colace (over the counter) 100 mg twice a day.  Use MiraLax (over the counter) for constipation as needed.   Diet - low sodium heart healthy   Complete by: As directed    Discharge instructions   Complete by: As directed    Maintain surgical dressing until follow up in the clinic. If the edges start to pull up, may reinforce with tape. If the dressing is no longer working, may remove and cover with gauze and tape, but must keep the area dry and clean.  Follow up in 2 weeks at [facility]. Call with any questions or concerns.   Discharge instructions   Complete by: As directed    Check CBC BMP and phosphorus level in 1 week from PCP.  Monitor blood pressure at home and if remains low continue to hold you Antihypertensives/BP meds and follow up with pcp   Please call call MD or return to ER for similar or worsening recurring problem that brought you to [facility] or if any fever,nausea/vomiting,abdominal pain, uncontrolled pain, chest pain,  shortness of breath or any other alarming symptoms.  Please follow-up your doctor as instructed in a week time and call the office for appointment.  Please avoid alcohol, smoking, or any other illicit substance and maintain healthy habits including taking your regular medications as prescribed.  You were cared for by a hospitalist during your [facility] stay. If you have any questions about your discharge medications or the care you received while you were in the [facility] after you are discharged, you can call the unit and ask to speak with the hospitalist on call if the hospitalist that took care of you is not available.  Once you are discharged, your primary care physician will handle any further medical issues. Please note that NO REFILLS for any discharge medications will be authorized once you are discharged, as it is imperative that you return to your primary care physician (or  establish a relationship with a primary care physician if you do not have one) for your aftercare needs so that they can reassess your need for medications and monitor your lab values   Discharge wound care:   Complete by: As directed    Reinforce dressing  As needed   Increase activity slowly   Complete by: As directed    Increase activity slowly as tolerated   Complete by: As directed    Weight bearing as tolerated with assist device (walker, cane, etc) as directed, use it as long as suggested by your surgeon or therapist, typically at least 4-6 weeks.   TED hose   Complete by: As directed    Use stockings (TED hose) for 2 weeks on both leg(s).  You may remove them at night for sleeping.      Allergies as of 02/21/2020       Reactions   Fentanyl And Related    Causes  nausea  Medication List     TAKE these medications    aspirin 81 MG chewable tablet Chew 1 tablet (81 mg total) by mouth 2 (two) times daily for 28 days.   bisacodyl 10 MG suppository Commonly known as: DULCOLAX Place 1 suppository (10 mg total) rectally daily as needed for moderate constipation.   clobetasol cream 0.05 % Commonly known as: TEMOVATE Apply 1 application topically 2 (two) times daily.   ferrous sulfate 325 (65 FE) MG tablet Take 1 tablet (325 mg total) by mouth 3 (three) times daily after meals for 14 days.   HYDROcodone-acetaminophen 5-325 MG tablet Commonly known as: NORCO/VICODIN Take 1-2 tablets by mouth every 6 (six) hours as needed for moderate pain (pain score 4-6).   lisinopril-hydrochlorothiazide 10-12.5 MG tablet Commonly known as: ZESTORETIC Take 1 tablet by mouth daily.   methocarbamol 500 MG tablet Commonly known as: ROBAXIN Take 1 tablet (500 mg total) by mouth every 6 (six) hours as needed for muscle spasms.   multivitamin with minerals Tabs tablet Take 1 tablet by mouth daily.   potassium & sodium phosphates 280-160-250 MG Pack Commonly known as:  PHOS-NAK Take 1 packet by mouth 4 (four) times daily -  with meals and at bedtime for 3 days.   VITAMIN D PO Take 1 tablet by mouth daily.               Durable Medical Equipment  (From admission, onward)           Start     Ordered   02/20/20 1333  For home use only DME Walker rolling  Once       Question Answer Comment  Walker: With 5 Inch Wheels   Patient needs a walker to treat with the following condition S/P total hip arthroplasty      02/20/20 1332   02/20/20 1333  For home use only DME Walker rolling  Once       Comments: Rolling walker with 5" wheels;  Question Answer Comment  Walker: With 5 Inch Wheels   Patient needs a walker to treat with the following condition S/p left hip fracture      02/20/20 1333   02/20/20 1333  For home use only DME Other see comment  Once       Comments: 3in1 (PT) (youth ht RW)  Question:  Length of Need  Answer:  Lifetime   02/20/20 1333   02/20/20 1332  For home use only DME 3 n 1  Once        02/20/20 1332              Discharge Care Instructions  (From admission, onward)           Start     Ordered   02/21/20 0000  Discharge wound care:       Comments: Reinforce dressing  As needed   02/21/20 1148   02/20/20 0000  Change dressing       Comments: Maintain surgical dressing until follow up in the clinic. If the edges start to pull up, may reinforce with tape. If the dressing is no longer working, may remove and cover with gauze and tape, but must keep the area dry and clean.  Call with any questions or concerns.   02/20/20 1325            Follow-up Information     Durene Romans, MD. Schedule an appointment as soon as possible for a visit in 2 weeks.  Specialty: Orthopedic Surgery Contact information: [address] [place] [address] [phone]         Wendy Montana, MD Follow up in 1 week(s).   Specialty: Family Medicine Why: Check CBC BMP and phosphate level Contact  information: [address] [place] [address] [phone]         [facility], [facility] Follow up.   Specialty: Home Health Services Why: to provide home health physical and occupational therapy Contact information: [address] [place] [address] [phone]                Allergies  Allergen Reactions   Fentanyl And Related     Causes  nausea     The results of significant diagnostics from this hospitalization (including imaging, microbiology, ancillary and laboratory) are listed below for reference.    Microbiology: Recent Results (from the past 240 hour(s))  Respiratory Panel by RT PCR (Flu A&B, Covid) - Nasopharyngeal Swab     Status: None   Collection Time: 02/18/20  1:02 AM   Specimen: Nasopharyngeal Swab  Result Value Ref Range Status   SARS Coronavirus 2 by RT PCR NEGATIVE NEGATIVE Final    Comment: (NOTE) SARS-CoV-2 target nucleic acids are NOT DETECTED.  The SARS-CoV-2 RNA is generally detectable in upper respiratoy specimens during the acute phase of infection. The lowest concentration of SARS-CoV-2 viral copies this assay can detect is 131 copies/mL. A negative result does not preclude SARS-Cov-2 infection and should not be used as the sole basis for treatment or other patient management decisions. A negative result may occur with  improper specimen collection/handling, submission of specimen other than nasopharyngeal swab, presence of viral mutation(s) within the areas targeted by this assay, and inadequate number of viral copies (<131 copies/mL). A negative result must be combined with clinical observations, patient history, and epidemiological information. The expected result is Negative.  Fact Sheet for Patients:  https://www.moore.com/  Fact Sheet for Healthcare Providers:  https://www.young.biz/  This test is no t yet approved or cleared by the [place]  FDA and  has been authorized for detection and/or diagnosis of SARS-CoV-2 by FDA under an Emergency Use Authorization (EUA). This EUA will remain  in effect (meaning this test can be used) for the duration of the COVID-19 declaration under Section 564(b)(1) of the Act, 21 U.S.C. section 360bbb-3(b)(1), unless the authorization is terminated or revoked sooner.     Influenza A by PCR NEGATIVE NEGATIVE Final   Influenza B by PCR NEGATIVE NEGATIVE Final    Comment: (NOTE) The Xpert Xpress SARS-CoV-2/FLU/RSV assay is intended as an aid in  the diagnosis of influenza from Nasopharyngeal swab specimens and  should not be used as a sole basis for treatment. Nasal washings and  aspirates are unacceptable for Xpert Xpress SARS-CoV-2/FLU/RSV  testing.  Fact Sheet for Patients: https://www.moore.com/  Fact Sheet for Healthcare Providers: https://www.young.biz/  This test is not yet approved or cleared by the [place] FDA and  has been authorized for detection and/or diagnosis of SARS-CoV-2 by  FDA under an Emergency Use Authorization (EUA). This EUA will remain  in effect (meaning this test can be used) for the duration of the  Covid-19 declaration under Section 564(b)(1) of the Act, 21  U.S.C. section 360bbb-3(b)(1), unless the authorization is  terminated or revoked. Performed at [facility], [address], [place], [place] [address]     Procedures/Studies: DG Chest 1 View  Result Date:  02/18/2020 CLINICAL DATA:  Fall EXAM: CHEST  1 VIEW COMPARISON:  June 29, 2017 FINDINGS: The heart size and mediastinal contours are within normal limits. Both lungs are clear. The visualized skeletal structures are unremarkable. IMPRESSION: No active disease. Electronically Signed   By: Jonna Clark M.D.   On: 02/18/2020 01:02   DG Pelvis Portable  Result Date: 02/18/2020 CLINICAL DATA:  Status post left hip replacement EXAM: PORTABLE  PELVIS 1-2 VIEWS COMPARISON:  Intraoperative films from earlier in the same day. FINDINGS: Left hip prosthesis is now seen. No other acute bony abnormality is noted. Degenerative changes of lumbar spine are seen. No soft tissue abnormality is seen. IMPRESSION: Status post left hip replacement Electronically Signed   By: Alcide Clever M.D.   On: 02/18/2020 19:14   DG C-Arm 1-60 Min-No Report  Result Date: 02/18/2020 CLINICAL DATA:  ORIF left hip fracture. EXAM: OPERATIVE LEFT HIP (WITH PELVIS IF PERFORMED) TECHNIQUE: Fluoroscopic spot image(s) were submitted for interpretation post-operatively. COMPARISON:  Preoperative radiograph earlier this day. FINDINGS: Three fluoroscopic spot views obtained in the operating room. Left femoral neck fracture. Subsequent acetabular cup, the femoral head component is not visualized on these provided fluoroscopic views. Total fluoroscopy time 18 seconds. IMPRESSION: Fluoroscopic spot views during left hip arthroplasty. Electronically Signed   By: Narda Rutherford M.D.   On: 02/18/2020 19:24   DG Hip Unilat W or Wo Pelvis 1 View Left  Result Date: 02/18/2020 CLINICAL DATA:  Fall and pain EXAM: DG HIP (WITH OR WITHOUT PELVIS) 1V*L* COMPARISON:  None. FINDINGS: There is a comminuted mildly displaced fracture of the transcervical left femoral neck. There is superior subluxation of the femoral shaft. The femoral head is still well seated within the acetabulum. No other definite fracture seen. IMPRESSION: Mildly displaced left transcervical femoral neck fracture. Electronically Signed   By: Jonna Clark M.D.   On: 02/18/2020 01:01   DG HIP OPERATIVE UNILAT W OR W/O PELVIS LEFT  Result Date: 02/18/2020 CLINICAL DATA:  ORIF left hip fracture. EXAM: OPERATIVE LEFT HIP (WITH PELVIS IF PERFORMED) TECHNIQUE: Fluoroscopic spot image(s) were submitted for interpretation post-operatively. COMPARISON:  Preoperative radiograph earlier this day. FINDINGS: Three fluoroscopic spot views  obtained in the operating room. Left femoral neck fracture. Subsequent acetabular cup, the femoral head component is not visualized on these provided fluoroscopic views. Total fluoroscopy time 18 seconds. IMPRESSION: Fluoroscopic spot views during left hip arthroplasty. Electronically Signed   By: Narda Rutherford M.D.   On: 02/18/2020 19:24    Labs: BNP (last 3 results) No results for input(s): BNP in the last 8760 hours. Basic Metabolic Panel: Recent Labs  Lab 02/18/20 0041 02/18/20 1308 02/19/20 0345 02/20/20 0357 02/21/20 0319  NA 133* 133* 131* 135  134*  --   K 3.4* 4.7 3.9 4.1  4.1  --   CL 97* 97* 98 100  99  --   CO2 25 27 24 29  29   --   GLUCOSE 158* 118* 139* 119*  120*  --   BUN 16 15 13 19  18   --   CREATININE 1.19* 1.16* 1.12* 1.00  1.01*  --   CALCIUM 8.8* 8.8* 7.9* 8.1*  8.2*  --   MG  --  2.0  --  1.9  --   PHOS  --  3.0  --  1.7* 2.2*   Liver Function Tests: Recent Labs  Lab 02/18/20 0041 02/18/20 1308 02/20/20 0357  AST 32  --   --   ALT  19  --   --   ALKPHOS 65  --   --   BILITOT 0.6  --   --   PROT 6.6  --   --   ALBUMIN 4.0 4.0 2.9*   No results for input(s): LIPASE, AMYLASE in the last 168 hours. No results for input(s): AMMONIA in the last 168 hours. CBC: Recent Labs  Lab 02/18/20 0041 02/18/20 1308 02/19/20 0345 02/20/20 0357 02/21/20 0319  WBC 12.5*  --  9.9 13.4* 9.3  NEUTROABS 9.6*  --   --   --   --   HGB 11.6* 11.8* 9.0* 8.2* 8.2*  HCT 33.5* 34.3* 26.5* 24.9* 24.8*  MCV 91.8  --  93.6 94.3 95.4  PLT 209  --  146* 149* 168   Cardiac Enzymes: Recent Labs  Lab 02/18/20 1308  CKTOTAL 298*   BNP: Invalid input(s): POCBNP CBG: No results for input(s): GLUCAP in the last 168 hours. D-Dimer No results for input(s): DDIMER in the last 72 hours. Hgb A1c Recent Labs    02/19/20 0345  HGBA1C 6.0*   Lipid Profile No results for input(s): CHOL, HDL, LDLCALC, TRIG, CHOLHDL, LDLDIRECT in the last 72 hours. Thyroid  function studies No results for input(s): TSH, T4TOTAL, T3FREE, THYROIDAB in the last 72 hours.  Invalid input(s): FREET3 Anemia work up No results for input(s): VITAMINB12, FOLATE, FERRITIN, TIBC, IRON, RETICCTPCT in the last 72 hours. Urinalysis    Component Value Date/Time   COLORURINE YELLOW 07/03/2017 1145   APPEARANCEUR CLEAR 07/03/2017 1145   LABSPEC 1.020 07/03/2017 1145   PHURINE 5.0 07/03/2017 1145   GLUCOSEU NEGATIVE 07/03/2017 1145   HGBUR NEGATIVE 07/03/2017 1145   BILIRUBINUR NEGATIVE 07/03/2017 1145   KETONESUR 5 (A) 07/03/2017 1145   PROTEINUR NEGATIVE 07/03/2017 1145   NITRITE NEGATIVE 07/03/2017 1145   LEUKOCYTESUR MODERATE (A) 07/03/2017 1145   Sepsis Labs Invalid input(s): PROCALCITONIN,  WBC,  LACTICIDVEN Microbiology Recent Results (from the past 240 hour(s))  Respiratory Panel by RT PCR (Flu A&B, Covid) - Nasopharyngeal Swab     Status: None   Collection Time: 02/18/20  1:02 AM   Specimen: Nasopharyngeal Swab  Result Value Ref Range Status   SARS Coronavirus 2 by RT PCR NEGATIVE NEGATIVE Final    Comment: (NOTE) SARS-CoV-2 target nucleic acids are NOT DETECTED.  The SARS-CoV-2 RNA is generally detectable in upper respiratoy specimens during the acute phase of infection. The lowest concentration of SARS-CoV-2 viral copies this assay can detect is 131 copies/mL. A negative result does not preclude SARS-Cov-2 infection and should not be used as the sole basis for treatment or other patient management decisions. A negative result may occur with  improper specimen collection/handling, submission of specimen other than nasopharyngeal swab, presence of viral mutation(s) within the areas targeted by this assay, and inadequate number of viral copies (<131 copies/mL). A negative result must be combined with clinical observations, patient history, and epidemiological information. The expected result is Negative.  Fact Sheet for Patients:   https://www.moore.com/https://www.fda.gov/media/142436/download  Fact Sheet for Healthcare Providers:  https://www.young.biz/https://www.fda.gov/media/142435/download  This test is no t yet approved or cleared by the [place] FDA and  has been authorized for detection and/or diagnosis of SARS-CoV-2 by FDA under an Emergency Use Authorization (EUA). This EUA will remain  in effect (meaning this test can be used) for the duration of the COVID-19 declaration under Section 564(b)(1) of the Act, 21 U.S.C. section 360bbb-3(b)(1), unless the authorization is terminated or revoked sooner.  Influenza A by PCR NEGATIVE NEGATIVE Final   Influenza B by PCR NEGATIVE NEGATIVE Final    Comment: (NOTE) The Xpert Xpress SARS-CoV-2/FLU/RSV assay is intended as an aid in  the diagnosis of influenza from Nasopharyngeal swab specimens and  should not be used as a sole basis for treatment. Nasal washings and  aspirates are unacceptable for Xpert Xpress SARS-CoV-2/FLU/RSV  testing.  Fact Sheet for Patients: https://www.moore.com/  Fact Sheet for Healthcare Providers: https://www.young.biz/  This test is not yet approved or cleared by the [place] FDA and  has been authorized for detection and/or diagnosis of SARS-CoV-2 by  FDA under an Emergency Use Authorization (EUA). This EUA will remain  in effect (meaning this test can be used) for the duration of the  Covid-19 declaration under Section 564(b)(1) of the Act, 21  U.S.C. section 360bbb-3(b)(1), unless the authorization is  terminated or revoked. Performed at [facility], [address], [place], [place] [address]      Time coordinating discharge: 35 minutes  SIGNED: Lanae Boast, MD  [facility] 02/21/2020, 11:49 AM  If 7PM-7AM, please contact night-coverage [email or web address]

## 2020-02-20 NOTE — Plan of Care (Signed)
  Problem: Education: Goal: Knowledge of General Education information will improve Description: Including pain rating scale, medication(s)/side effects and non-pharmacologic comfort measures Outcome: Progressing   Problem: Health Behavior/Discharge Planning: Goal: Ability to manage health-related needs will improve Outcome: Progressing   Problem: Clinical Measurements: Goal: Will remain free from infection Outcome: Progressing   

## 2020-02-20 NOTE — Progress Notes (Signed)
PROGRESS NOTE    Wendy Lewis  CXK:[id number] DOB: 1940-07-08 DOA: 02/18/2020 PCP: [name], MD   Chief Complaint  Patient presents with  . Fall   Brief Narrative: 79 year old female with history of cerebral palsy, HTN and anemia presenting with left hip pain after accidental fall in an attempt to avoid frog in her kitchen. No head trauma or loss of consciousness.  In ED, hemodynamically stable. Na 133. K3.4. Cr 1.19. WBC 12.5. Hgb 11.6. Influenza and COVID-19 PCR negative. CXR without acute finding. Hip x-ray with mildly displaced left transcervical femoral neck fracture. Orthopedic surgery consulted.  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 Post op doing well  Subjective: Seen this morning pain is controlled.  She reports she is going to her daughter's house. Pain is controlled Waiting to work with physical therapy today   Assessment & Plan:  Left femoral neck fracture mildly displaced from fall: status post left total hip replacement by Dr. Charlann Boxer, continue pain control, DVT prophylaxis with aspirin twice daily x4 weeks as per orthopedics continue continue muscle relaxant, oral opiates for pain control.  Continue PT OT and will continue to work with PT daily.  Home health ordered. Daughter working on transition patient to her home- will require some time to plan.  Anticipating discharge tomorrow  Hypophosphatemia-added oral phosphate  Hyponatremia/Hypokalemia: resolved  Normocytic anemia: Mild drop likely postop anemia from blood loss.  Repeat H&H in the morning on iron supplementation for discharge. Recent Labs  Lab 02/18/20 0041 02/18/20 1308 02/19/20 0345 02/20/20 0357  HGB 11.6* 11.8* 9.0* 8.2*  HCT 33.5* 34.3* 26.5* 24.9*   Thrombocytopenia: Stable 149, monitor  Cerebral palsy history, at baseline and independent prior to fall  Hypertension: BP well controlled.  Home HCTZ lisinopril insulin hold  Nutrition: Diet Order            Diet -  low sodium heart healthy           Diet regular Room service appropriate? Yes; Fluid consistency: Thin  Diet effective now                 Nutrition Problem: Increased nutrient needs Etiology: hip fracture, post-op healing Signs/Symptoms: estimated needs Interventions: Ensure Enlive (each supplement provides 350kcal and 20 grams of protein), MVI  Body mass index is 20.6 kg/m.   DVT prophylaxis: SCDs Start: 02/18/20 1950 Place TED hose Start: 02/18/20 1950 SCDs Start: 02/18/20 0316 Code Status:   Code Status: Full Code  Family Communication: plan of care discussed with patient at bedside. Called and updated daughter. Spoke w/ ortho team.  Status is: Inpatient Remains inpatient appropriate because:Unsafe d/c plan and Inpatient level of care appropriate due to severity of illness Dispo: The patient is from: Home              Anticipated d/c is to: Home (daughter's home)              Anticipated d/c date is: 1 day              Patient currently is not medically stable to d/c. Consultants:see note  Procedures:see note  Culture/Microbiology No results found for: SDES, SPECREQUEST, CULT, REPTSTATUS  Other culture-see note  Medications: Scheduled Meds: . aspirin  81 mg Oral BID  . docusate sodium  100 mg Oral BID  . feeding supplement (ENSURE ENLIVE)  237 mL Oral BID BM  . ferrous sulfate  325 mg Oral TID PC  . multivitamin with minerals  1 tablet  Oral Daily  . polyethylene glycol  17 g Oral BID  . potassium & sodium phosphates  1 packet Oral TID WC & HS   Continuous Infusions: . sodium chloride Stopped (02/20/20 0100)  . methocarbamol (ROBAXIN) IV      Antimicrobials: Anti-infectives (From admission, onward)   Start     Dose/Rate Route Frequency Ordered Stop   02/18/20 2000  ceFAZolin (ANCEF) IVPB 1 g/50 mL premix        1 g 100 mL/hr over 30 Minutes Intravenous Every 6 hours 02/18/20 1950 02/19/20 0724   02/18/20 1445  ceFAZolin (ANCEF) IVPB 2g/100 mL premix         2 g 200 mL/hr over 30 Minutes Intravenous On call to O.R. 02/18/20 1441 02/18/20 1712     Objective: Vitals: Today's Vitals   02/20/20 0637 02/20/20 0855 02/20/20 1000 02/20/20 1317  BP: 125/67   (!) 143/81  Pulse: 90   98  Resp: 14   17  Temp: 98 F (36.7 C)   98 F (36.7 C)  TempSrc: Oral     SpO2: 93%     Weight:      Height:      PainSc:  6  0-No pain     Intake/Output Summary (Last 24 hours) at 02/20/2020 1421 Last data filed at 02/20/2020 1318 Gross per 24 hour  Intake 767 ml  Output 1050 ml  Net -283 ml   Filed Weights   02/18/20 0716  Weight: 46.3 kg   Weight change:   Intake/Output from previous day: 10/05 0701 - 10/06 0700 In: 932.7 [P.O.:537; I.V.:395.7] Out: 1250 [Urine:1250] Intake/Output this shift: Total I/O In: 480 [P.O.:480] Out: 200 [Urine:200]  Examination: General exam: AAO. pleasant ,NAD, weak appearing. HEENT:Oral mucosa moist, Ear/Nose WNL grossly,dentition normal. Respiratory system: bilaterally clear,no wheezing or crackles,no use of accessory muscle, non tender. Cardiovascular system: S1 & S2 +, regular, No JVD. Gastrointestinal system: Abdomen soft, NT,ND, BS+. Nervous System:Alert, awake, moving extremities and grossly nonfocal Extremities: Left hip surgical site with Aquacel dressing in place no edema, distal peripheral pulses palpable.  Skin: No rashes,no icterus. MSK: Normal muscle bulk,tone, power  Data Reviewed: I have personally reviewed following labs and imaging studies CBC: Recent Labs  Lab 02/18/20 0041 02/18/20 1308 02/19/20 0345 02/20/20 0357  WBC 12.5*  --  9.9 13.4*  NEUTROABS 9.6*  --   --   --   HGB 11.6* 11.8* 9.0* 8.2*  HCT 33.5* 34.3* 26.5* 24.9*  MCV 91.8  --  93.6 94.3  PLT 209  --  146* 149*   Basic Metabolic Panel: Recent Labs  Lab 02/18/20 0041 02/18/20 1308 02/19/20 0345 02/20/20 0357  NA 133* 133* 131* 135  134*  K 3.4* 4.7 3.9 4.1  4.1  CL 97* 97* 98 100  99  CO2 25 27 24 29  29     GLUCOSE 158* 118* 139* 119*  120*  BUN 16 15 13 19  18   CREATININE 1.19* 1.16* 1.12* 1.00  1.01*  CALCIUM 8.8* 8.8* 7.9* 8.1*  8.2*  MG  --  2.0  --  1.9  PHOS  --  3.0  --  1.7*   GFR: Estimated Creatinine Clearance: 31.3 mL/min (A) (by C-G formula based on SCr of 1.01 mg/dL (H)). Liver Function Tests: Recent Labs  Lab 02/18/20 0041 02/18/20 1308 02/20/20 0357  AST 32  --   --   ALT 19  --   --   ALKPHOS 65  --   --  BILITOT 0.6  --   --   PROT 6.6  --   --   ALBUMIN 4.0 4.0 2.9*   No results for input(s): LIPASE, AMYLASE in the last 168 hours. No results for input(s): AMMONIA in the last 168 hours. Coagulation Profile: Recent Labs  Lab 02/18/20 0041  INR 1.0   Cardiac Enzymes: Recent Labs  Lab 02/18/20 1308  CKTOTAL 298*   BNP (last 3 results) No results for input(s): PROBNP in the last 8760 hours. HbA1C: Recent Labs    02/18/20 0041 02/19/20 0345  HGBA1C 5.9* 6.0*   CBG: No results for input(s): GLUCAP in the last 168 hours. Lipid Profile: No results for input(s): CHOL, HDL, LDLCALC, TRIG, CHOLHDL, LDLDIRECT in the last 72 hours. Thyroid Function Tests: No results for input(s): TSH, T4TOTAL, FREET4, T3FREE, THYROIDAB in the last 72 hours. Anemia Panel: No results for input(s): VITAMINB12, FOLATE, FERRITIN, TIBC, IRON, RETICCTPCT in the last 72 hours. Sepsis Labs: No results for input(s): PROCALCITON, LATICACIDVEN in the last 168 hours.  Recent Results (from the past 240 hour(s))  Respiratory Panel by RT PCR (Flu A&B, Covid) - Nasopharyngeal Swab     Status: None   Collection Time: 02/18/20  1:02 AM   Specimen: Nasopharyngeal Swab  Result Value Ref Range Status   SARS Coronavirus 2 by RT PCR NEGATIVE NEGATIVE Final    Comment: (NOTE) SARS-CoV-2 target nucleic acids are NOT DETECTED.  The SARS-CoV-2 RNA is generally detectable in upper respiratoy specimens during the acute phase of infection. The lowest concentration of SARS-CoV-2 viral  copies this assay can detect is 131 copies/mL. A negative result does not preclude SARS-Cov-2 infection and should not be used as the sole basis for treatment or other patient management decisions. A negative result may occur with  improper specimen collection/handling, submission of specimen other than nasopharyngeal swab, presence of viral mutation(s) within the areas targeted by this assay, and inadequate number of viral copies (<131 copies/mL). A negative result must be combined with clinical observations, patient history, and epidemiological information. The expected result is Negative.  Fact Sheet for Patients:  https://www.moore.com/https://www.fda.gov/media/142436/download  Fact Sheet for Healthcare Providers:  https://www.young.biz/https://www.fda.gov/media/142435/download  This test is no t yet approved or cleared by the [place] FDA and  has been authorized for detection and/or diagnosis of SARS-CoV-2 by FDA under an Emergency Use Authorization (EUA). This EUA will remain  in effect (meaning this test can be used) for the duration of the COVID-19 declaration under Section 564(b)(1) of the Act, 21 U.S.C. section 360bbb-3(b)(1), unless the authorization is terminated or revoked sooner.     Influenza A by PCR NEGATIVE NEGATIVE Final   Influenza B by PCR NEGATIVE NEGATIVE Final    Comment: (NOTE) The Xpert Xpress SARS-CoV-2/FLU/RSV assay is intended as an aid in  the diagnosis of influenza from Nasopharyngeal swab specimens and  should not be used as a sole basis for treatment. Nasal washings and  aspirates are unacceptable for Xpert Xpress SARS-CoV-2/FLU/RSV  testing.  Fact Sheet for Patients: https://www.moore.com/https://www.fda.gov/media/142436/download  Fact Sheet for Healthcare Providers: https://www.young.biz/https://www.fda.gov/media/142435/download  This test is not yet approved or cleared by the [place] FDA and  has been authorized for detection and/or diagnosis of SARS-CoV-2 by  FDA under an Emergency Use Authorization (EUA). This  EUA will remain  in effect (meaning this test can be used) for the duration of the  Covid-19 declaration under Section 564(b)(1) of the Act, 21  U.S.C. section 360bbb-3(b)(1), unless the authorization is  terminated or revoked.  Performed at [facility], [address] [place], [place] [address]      Radiology Studies: DG Pelvis Portable  Result Date: 02/18/2020 CLINICAL DATA:  Status post left hip replacement EXAM: PORTABLE PELVIS 1-2 VIEWS COMPARISON:  Intraoperative films from earlier in the same day. FINDINGS: Left hip prosthesis is now seen. No other acute bony abnormality is noted. Degenerative changes of lumbar spine are seen. No soft tissue abnormality is seen. IMPRESSION: Status post left hip replacement Electronically Signed   By: Alcide Clever M.D.   On: 02/18/2020 19:14   DG C-Arm 1-60 Min-No Report  Result Date: 02/18/2020 CLINICAL DATA:  ORIF left hip fracture. EXAM: OPERATIVE LEFT HIP (WITH PELVIS IF PERFORMED) TECHNIQUE: Fluoroscopic spot image(s) were submitted for interpretation post-operatively. COMPARISON:  Preoperative radiograph earlier this day. FINDINGS: Three fluoroscopic spot views obtained in the operating room. Left femoral neck fracture. Subsequent acetabular cup, the femoral head component is not visualized on these provided fluoroscopic views. Total fluoroscopy time 18 seconds. IMPRESSION: Fluoroscopic spot views during left hip arthroplasty. Electronically Signed   By: Narda Rutherford M.D.   On: 02/18/2020 19:24   DG HIP OPERATIVE UNILAT W OR W/O PELVIS LEFT  Result Date: 02/18/2020 CLINICAL DATA:  ORIF left hip fracture. EXAM: OPERATIVE LEFT HIP (WITH PELVIS IF PERFORMED) TECHNIQUE: Fluoroscopic spot image(s) were submitted for interpretation post-operatively. COMPARISON:  Preoperative radiograph earlier this day. FINDINGS: Three fluoroscopic spot views obtained in the operating room. Left femoral neck fracture. Subsequent acetabular cup, the  femoral head component is not visualized on these provided fluoroscopic views. Total fluoroscopy time 18 seconds. IMPRESSION: Fluoroscopic spot views during left hip arthroplasty. Electronically Signed   By: Narda Rutherford M.D.   On: 02/18/2020 19:24     LOS: 2 days   Lanae Boast, MD [facility]  02/20/2020, 2:21 PM

## 2020-02-20 NOTE — Progress Notes (Signed)
Physical Therapy Treatment Patient Details Name: Wendy Lewis MRN: [id number] DOB: June 28, 1940 Today's Date: 02/20/2020    History of Present Illness 79 y.o. female with history of hypertension anemia and cerebral palsy.  She had a fall at home when she was trying to avoid a frog in the kitchen.  xray= L hip fx. pt is s/p R DA THA per Dr Charlann Boxer on 02/18/20    PT Comments    POD # 2 pm session Daughter present during session for care/mobility education.  General transfer comment: assisted from recliner to [facility].  Daughter present and educated on safe handling.  Assisted with peri care as pt was unable to self perform and maintain a safe standing balance.  Tremors.Hx Cerebral Palsy.  General stair comments: attempted stairs with pt and daughter present.  Pt was unable to complete 5 steps she has to enter her home.  Pt required MAX Assist and was too unsteady to safely complete.General bed mobility comments: assist with LLE and increased time positioned to comfort.  Applied ICE Pt did not meet goals to safely D/C to home today   Follow Up Recommendations  Home health PT;Follow surgeon's recommendation for DC plan and follow-up therapies     Equipment Recommendations  Rolling walker with 5" wheels;3in1 (PT)    Recommendations for Other Services       Precautions / Restrictions Precautions Precautions: Fall Precaution Comments: Hx CP Restrictions Weight Bearing Restrictions: No Other Position/Activity Restrictions: WBAT    Mobility  Bed Mobility Overal bed mobility: Needs Assistance Bed Mobility: Sit to Supine     Supine to sit: Min assist     General bed mobility comments: assist with LLE and increased time positioned to comfort  Transfers Overall transfer level: Needs assistance Equipment used: Rolling walker (2 wheeled) Transfers: Sit to/from [employer] Sit to Stand: Min assist Stand pivot transfers: Min assist;Mod assist       General transfer  comment: assisted from recliner to [facility].  Daughter present and educated on safe handling.  Assisted with peri care as pt was unable to self perform and maintain a safe standing balance.  Tremors.  Ambulation/Gait Ambulation/Gait assistance: Min assist Gait Distance (Feet): 22 Feet Assistive device: Rolling walker (2 wheeled) Gait Pattern/deviations: Step-to pattern;Decreased dorsiflexion - left;Decreased stance time - left Gait velocity: decreased   General Gait Details: decreased amb distance to perform stairs.  Educated daughter on safe handling and "hands on" assist all times as pt is a HIGH FALL RISK esp with turns and backward steps.   Stairs Stairs: Yes Stairs assistance: Max assist Stair Management: No rails Number of Stairs: 2 General stair comments: attempted stairs with pt and daughter present.  Pt was unable to complete 5 steps she has to enter her home.  Pt required MAX Assist and was too unsteady to safely complete.   Wheelchair Mobility    Modified Rankin (Stroke Patients Only)       Balance                                            Cognition Arousal/Alertness: Awake/alert Behavior During Therapy: WFL for tasks assessed/performed Overall Cognitive Status: Within Functional Limits for tasks assessed  General Comments: AxO x 3 very pleasant      Exercises      General Comments        Pertinent Vitals/Pain Pain Assessment: 0-10 Pain Score: 3  Pain Location: left hip Pain Descriptors / Indicators: Aching;Sore;Operative site guarding Pain Intervention(s): Monitored during session;Repositioned;Ice applied    Home Living                      Prior Function            PT Goals (current goals can now be found in the care plan section) Progress towards PT goals: Progressing toward goals    Frequency    7X/week      PT Plan Current plan remains appropriate     Co-evaluation              AM-PAC PT "6 Clicks" Mobility   Outcome Measure  Help needed turning from your back to your side while in a flat bed without using bedrails?: A Little Help needed moving from lying on your back to sitting on the side of a flat bed without using bedrails?: A Little Help needed moving to and from a bed to a chair (including a wheelchair)?: A Little Help needed standing up from a chair using your arms (e.g., wheelchair or bedside chair)?: A Little Help needed to walk in hospital room?: A Little Help needed climbing 3-5 steps with a railing? : A Lot 6 Click Score: 17    End of Session Equipment Utilized During Treatment: Gait belt Activity Tolerance: Patient tolerated treatment well Patient left: in bed;with bed alarm set;with call bell/phone within reach Nurse Communication:  (pt sis not meet therapy goals to safely D/C to day) PT Visit Diagnosis: Difficulty in walking, not elsewhere classified (R26.2)     Time: 1410-1437 PT Time Calculation (min) (ACUTE ONLY): 27 min  Charges:  $Gait Training: 8-22 mins $Therapeutic Activity: 8-22 mins                     Felecia Shelling  PTA Acute  Rehabilitation Services Pager      [phone] Office      [phone]

## 2020-02-20 NOTE — TOC Transition Note (Signed)
Transition of Care [facility]) - CM/SW Discharge Note   Patient Details  Name: Wendy Lewis MRN: [id number] Date of Birth: 07/29/1940  [facility] CM/SW Contact:  Lennart Pall, LCSW Phone Number: 02/20/2020, 3:06 PM   Clinical Narrative:   Met with pt and daughter, Jeannene Patella, this afternoon and planning for dc tomorrow.  Reviewed DME and HH recs - agreeable.  Referrals made (see below).  No further TOC needs at this time.    Final next level of care: [place] Barriers to Discharge: Continued Medical Work up   Patient Goals and CMS Choice Patient states their goals for this hospitalization and ongoing recovery are:: to return home CMS Medicare.gov Compare Post Acute Care list provided to:: Patient Choice offered to / list presented to : Patient  Discharge Placement                       Discharge Plan and Services In-house Referral: Clinical Social Work              DME Arranged: 3-N-1, Environmental consultant youth DME Agency: [employer] Date DME Agency Contacted: 02/20/20 Time DME Agency Contacted: 65 Representative spoke with at DME Agency: Freda Munro HH Arranged: PT, OT HH Agency: [place] Date [place]: 02/20/20 Time [place]: 50 Representative spoke with at [place]: [place] ([place]) Interventions     Readmission Risk Interventions Readmission Risk Prevention Plan 02/18/2020  Post Dischage Appt Complete  Medication Screening Complete  Transportation Screening Complete  Some recent data might be hidden

## 2020-02-20 NOTE — Progress Notes (Signed)
     Subjective: 2 Days Post-Op Procedure(s) (LRB): TOTAL HIP ARTHROPLASTY ANTERIOR APPROACH (Left)   Patient reports pain as mild, pain controlled.  No reported events throughout the night.  Discussed expectations moving forward.  Orthopaedically stable.  Follow up in the clinic in 2 weeks.  Knows to call with any questions or concerns.      Objective:   VITALS:   Vitals:   02/20/20 0637 02/20/20 1317  BP: 125/67 (!) 143/81  Pulse: 90 98  Resp: 14 17  Temp: 98 F (36.7 C) 98 F (36.7 C)  SpO2: 93%     Dorsiflexion/Plantar flexion intact Incision: dressing C/D/I No cellulitis present Compartment soft  LABS Recent Labs    02/18/20 0041 02/18/20 0041 02/18/20 1308 02/19/20 0345 02/20/20 0357  HGB 11.6*   < > 11.8* 9.0* 8.2*  HCT 33.5*   < > 34.3* 26.5* 24.9*  WBC 12.5*  --   --  9.9 13.4*  PLT 209  --   --  146* 149*   < > = values in this interval not displayed.    Recent Labs    02/18/20 1308 02/19/20 0345 02/20/20 0357  NA 133* 131* 135  134*  K 4.7 3.9 4.1  4.1  BUN 15 13 19  18   CREATININE 1.16* 1.12* 1.00  1.01*  GLUCOSE 118* 139* 119*  120*     Assessment/Plan: 2 Days Post-Op Procedure(s) (LRB): TOTAL HIP ARTHROPLASTY ANTERIOR APPROACH (Left)   Up with therapy Discharge home when ready medically, orthopaedically stable Follow up in 2 weeks at [facility] Follow up with OLIN,Pavel Gadd D in 2 weeks.  Contact information:  [facility] [address], [address] [place] [phone]     Ortho recommendations:  ASA 81 mg bid for 4 weeks for anticoagulation, unless other medically indicated.  Norco for pain management (Rx written).  MiraLax and Colace for constipation  Iron 325 mg tid for 2-3 weeks   WBAT on the left leg.  Dressing to remain in place until follow in clinic in 2 weeks.  Dressing is waterproof and may shower with it in place.  Follow up in 2 weeks at [facility]. Follow  up with OLIN,Larrisha Babineau D in 2 weeks.  Contact information:  [facility] [address] [place] [address]             [phone] PA-C  [facility] [address], [address], [place], [place] Phone: [phone] [email or web address] Facebook  [phone]

## 2020-02-20 NOTE — Progress Notes (Signed)
Physical Therapy Treatment Patient Details Name: Wendy Lewis MRN: [id number] DOB: November 09, 1940 Today's Date: 02/20/2020    History of Present Illness 79 y.o. female with history of hypertension anemia and cerebral palsy.  She had a fall at home when she was trying to avoid a frog in the kitchen.  xray= L hip fx. pt is s/p R DA THA per Dr Charlann Boxer on 02/18/20    PT Comments    POD # 2 am session Assisted OOB.  General transfer comment: assist to rise and stabilize, cues for hand placement and safety.  Unsteady.  Tremors Hx Cerebral Palsy.  General transfer comment: assist to rise and stabilize, cues for hand placement and safety.  Unsteady.  Tremors Hx Cerebral Palsy.  General Gait Details: decreased amb distance due to increased pain today vs yesterday.  Unateady.  25% VC's on proper walker to self distance and safety with turns. Performed a few TE's followed by ICE.   Follow Up Recommendations  Home health PT;Follow surgeon's recommendation for DC plan and follow-up therapies     Equipment Recommendations  Rolling walker with 5" wheels;3in1 (PT)    Recommendations for Other Services       Precautions / Restrictions Precautions Precautions: Fall Precaution Comments: Hx CP Restrictions Weight Bearing Restrictions: No Other Position/Activity Restrictions: WBAT    Mobility  Bed Mobility Overal bed mobility: Needs Assistance Bed Mobility: Supine to Sit     Supine to sit: Min assist     General bed mobility comments: assist with LLE and increased time  Transfers Overall transfer level: Needs assistance Equipment used: Rolling walker (2 wheeled) Transfers: Sit to/from [employer] Sit to Stand: Min assist Stand pivot transfers: Min assist;Mod assist       General transfer comment: assist to rise and stabilize, cues for hand placement and safety.  Unsteady.  Tremors Hx Cerebral Palsy  Ambulation/Gait Ambulation/Gait assistance: Min assist Gait Distance  (Feet): 26 Feet Assistive device: Rolling walker (2 wheeled) Gait Pattern/deviations: Step-to pattern;Decreased dorsiflexion - left;Decreased stance time - left Gait velocity: decreased   General Gait Details: decreased amb distance due to increased pain today vs yesterday.  Unateady.  25% VC's on proper walker to self distance and safety with turns.   Stairs             Wheelchair Mobility    Modified Rankin (Stroke Patients Only)       Balance                                            Cognition Arousal/Alertness: Awake/alert Behavior During Therapy: WFL for tasks assessed/performed Overall Cognitive Status: Within Functional Limits for tasks assessed                                 General Comments: AxO x 3 very pleasant      Exercises   Total Hip Replacement TE's following HEP Handout 10 reps ankle pumps 05 reps knee presses 05 reps heel slides 05 reps SAQ's 05 reps ABD Instructed how to use a belt loop to assist  Followed by ICE     General Comments        Pertinent Vitals/Pain Pain Assessment: 0-10 Pain Score: 3  Pain Location: left hip Pain Descriptors / Indicators: Aching;Sore;Operative site guarding Pain Intervention(s): Monitored during  session;Repositioned;Ice applied    Home Living                      Prior Function            PT Goals (current goals can now be found in the care plan section) Progress towards PT goals: Progressing toward goals    Frequency    7X/week      PT Plan Current plan remains appropriate    Co-evaluation              AM-PAC PT "6 Clicks" Mobility   Outcome Measure  Help needed turning from your back to your side while in a flat bed without using bedrails?: A Little Help needed moving from lying on your back to sitting on the side of a flat bed without using bedrails?: A Little Help needed moving to and from a bed to a chair (including a wheelchair)?:  A Little Help needed standing up from a chair using your arms (e.g., wheelchair or bedside chair)?: A Little Help needed to walk in hospital room?: A Little Help needed climbing 3-5 steps with a railing? : A Lot 6 Click Score: 17    End of Session Equipment Utilized During Treatment: Gait belt Activity Tolerance: Patient tolerated treatment well Patient left: in chair;with call bell/phone within reach;with chair alarm set Nurse Communication: Mobility status PT Visit Diagnosis: Difficulty in walking, not elsewhere classified (R26.2)     Time: 1010-1035 PT Time Calculation (min) (ACUTE ONLY): 25 min  Charges:  $Gait Training: 8-22 mins $Therapeutic Exercise: 8-22 mins                     Felecia Shelling  PTA Acute  Rehabilitation Services Pager      [phone] Office      [phone]

## 2020-02-21 LAB — CBC
HCT: 24.8 % — ABNORMAL LOW (ref 36.0–46.0)
Hemoglobin: 8.2 g/dL — ABNORMAL LOW (ref 12.0–15.0)
MCH: 31.5 pg (ref 26.0–34.0)
MCHC: 33.1 g/dL (ref 30.0–36.0)
MCV: 95.4 fL (ref 80.0–100.0)
Platelets: 168 10*3/uL (ref 150–400)
RBC: 2.6 MIL/uL — ABNORMAL LOW (ref 3.87–5.11)
RDW: 13.2 % (ref 11.5–15.5)
WBC: 9.3 10*3/uL (ref 4.0–10.5)
nRBC: 0 % (ref 0.0–0.2)

## 2020-02-21 LAB — PHOSPHORUS: Phosphorus: 2.2 mg/dL — ABNORMAL LOW (ref 2.5–4.6)

## 2020-02-21 MED ORDER — LORATADINE 10 MG PO TABS
10.0000 mg | ORAL_TABLET | Freq: Every day | ORAL | Status: DC
  Administered 2020-02-21: 10 mg via ORAL
  Filled 2020-02-21: qty 1

## 2020-02-21 MED ORDER — BISACODYL 10 MG RE SUPP: 10.0000 mg | Freq: Every day | RECTAL | 0 refills | Status: DC | PRN

## 2020-02-21 MED ORDER — FLUTICASONE PROPIONATE 50 MCG/ACT NA SUSP
1.0000 | Freq: Every day | NASAL | Status: DC
  Administered 2020-02-21: 1 via NASAL
  Filled 2020-02-21: qty 16

## 2020-02-21 MED ORDER — POTASSIUM & SODIUM PHOSPHATES 280-160-250 MG PO PACK: 1.0000 | PACK | Freq: Three times a day (TID) | ORAL | 0 refills | Status: AC

## 2020-02-21 NOTE — Progress Notes (Signed)
Physical Therapy Treatment Patient Details Name: Wendy Lewis MRN: [id number] DOB: [birth year] Today's Date: 02/21/2020    History of Present Illness 79 y.o. female with history of hypertension anemia and cerebral palsy.  She had a fall at home when she was trying to avoid a frog in the kitchen.  xray= L hip fx. pt is s/p R DA THA per Dr Charlann Boxer on 02/18/20    PT Comments    POD # 3 General Gait Details: both daughters present for education on safe handling during amb and stairs.  Advised to have pt wear her gait belt issued to her for safety.  Pt is unsready with He Cerbral Palsy.  General stair comments: with both daughters present for education on safe handling and stair training.  Pt was able to perform with much assist and effort.  Pt started to c/o dizziness so returned to room.  RN called to room to varify standing BP 107/67 and pt stating "I need to sit down".  "My head feels funny"     Follow Up Recommendations  Home health PT;Follow surgeon's recommendation for DC plan and follow-up therapies     Equipment Recommendations  Rolling walker with 5" wheels;3in1 (PT)    Recommendations for Other Services       Precautions / Restrictions      Mobility  Bed Mobility               General bed mobility comments: OOB in recliner  Transfers Overall transfer level: Needs assistance Equipment used: Rolling walker (2 wheeled) Transfers: Sit to/from [employer] Transfers Sit to Stand: Supervision Stand pivot transfers: Supervision;Min guard       General transfer comment: assisted from recliner to amb with daughters  Ambulation/Gait Ambulation/Gait assistance: Min guard;Min assist Gait Distance (Feet): 25 Feet Assistive device: Rolling walker (2 wheeled) Gait Pattern/deviations: Step-to pattern;Decreased dorsiflexion - left;Decreased stance time - left Gait velocity: decreased   General Gait Details: both daughters present for education on safe handling during  amb and stairs.  Advised to have pt wear her gait belt issued to her for safety.  Pt is unsready with He Cerbral Palsy.   Stairs Stairs: Yes Stairs assistance: Mod assist Stair Management: No rails;With walker Number of Stairs: 2 General stair comments: with both daughters present for education on safe handling and stair training.  Pt was able to perform with much assist and effort.  Pt started to c/o dizziness so returned to room.  RN called to room to varify standing BP 107/67 and pt stating "I need to sit down".   Wheelchair Mobility    Modified Rankin (Stroke Patients Only)       Balance                                            Cognition Arousal/Alertness: Awake/alert Behavior During Therapy: WFL for tasks assessed/performed Overall Cognitive Status: Within Functional Limits for tasks assessed                                 General Comments: AxO x 3 very pleasant      Exercises      General Comments        Pertinent Vitals/Pain      Home Living  Prior Function            PT Goals (current goals can now be found in the care plan section) Progress towards PT goals: Progressing toward goals    Frequency    7X/week      PT Plan Current plan remains appropriate    Co-evaluation              AM-PAC PT "6 Clicks" Mobility   Outcome Measure  Help needed turning from your back to your side while in a flat bed without using bedrails?: A Little Help needed moving from lying on your back to sitting on the side of a flat bed without using bedrails?: A Little Help needed moving to and from a bed to a chair (including a wheelchair)?: A Little Help needed standing up from a chair using your arms (e.g., wheelchair or bedside chair)?: A Little Help needed to walk in hospital room?: A Little Help needed climbing 3-5 steps with a railing? : A Lot 6 Click Score: 17    End of Session  Equipment Utilized During Treatment: Gait belt Activity Tolerance: Other (comment) (dizzy) Patient left: in chair;with call bell/phone within reach;with chair alarm set Nurse Communication: Mobility status PT Visit Diagnosis: Difficulty in walking, not elsewhere classified (R26.2)     Time: 1130-1156 PT Time Calculation (min) (ACUTE ONLY): 26 min  Charges:  $Gait Training: 8-22 mins $Therapeutic Activity: 8-22 mins                     Felecia Shelling  PTA Acute  Rehabilitation Services Pager      [phone] Office      [phone]

## 2020-02-21 NOTE — Care Management Important Message (Signed)
Important Message  Patient Details IM Letter given to the patinet Name: [name] MRN: [id number] Date of Birth: Apr 10, 1941   Medicare Important Message Given:  Yes     Caren Macadam 02/21/2020, 1:01 PM

## 2020-02-21 NOTE — Plan of Care (Signed)
Patient stated she feels much better after Claritin. Patient was discharged home with home health. Adult children were present at discharge and instructions were gone over with children and patient. All questions were answered and patient was taken to main entrance.

## 2020-02-21 NOTE — Progress Notes (Signed)
Physical Therapy Treatment Patient Details Name: Wendy Lewis MRN: [id number] DOB: July 04, 1940 Today's Date: 02/21/2020    History of Present Illness 79 y.o. female with history of hypertension anemia and cerebral palsy.  She had a fall at home when she was trying to avoid a frog in the kitchen.  xray= L hip fx. pt is s/p R DA THA per Dr Charlann Boxer on 02/18/20    PT Comments    POD # 3 General transfer comment: assisted from bed to [facility].  Pt did well using B UE's to support self and complete turn.  While on BSC pt c/o "I don't feel good today" and c/o dizziness "my head feels funny".  Vitals taken BP was high.  Reported to RN and entered in [place].  Assisted off BSC to recliner and positioned to comfort.   Follow Up Recommendations  Home health PT;Follow surgeons recommendation for DC plan and follow-up therapies     Equipment Recommendations  Rolling walker with 5" wheels;3in1 (PT)    Recommendations for Other Services       Precautions / Restrictions Precautions Precautions: Fall Precaution Comments: Hx CP Restrictions Weight Bearing Restrictions: No Other Position/Activity Restrictions: WBAT    Mobility  Bed Mobility Overal bed mobility: Needs Assistance Bed Mobility: Supine to Sit     Supine to sit: Min assist     General bed mobility comments: assist with LLE and increased time  Transfers Overall transfer level: Needs assistance Equipment used: Rolling walker (2 wheeled) Transfers: Sit to/from [employer] Sit to Stand: Supervision Stand pivot transfers: Supervision;Min guard       General transfer comment: assisted from bed to [facility].  Pt did well using B UE's to support self and complete turn.  While on BSC pt c/o "I don't feel good today" and c/o dizziness "my head feels funny".  Vitals taken BP was high.  Reported to RN and entered in [place].  Assisted off BSC to recliner and positioned to comfort.  Ambulation/Gait             General Gait  Details: defered amb due to BP     Will allow pt to sit in recliner and rest then attempt a little later.   Stairs             Wheelchair Mobility    Modified Rankin (Stroke Patients Only)       Balance                                            Cognition Arousal/Alertness: Awake/alert Behavior During Therapy: WFL for tasks assessed/performed Overall Cognitive Status: Within Functional Limits for tasks assessed                                 General Comments: AxO x 3 very pleasant      Exercises      General Comments        Pertinent Vitals/Pain Pain Assessment: Faces Faces Pain Scale: Hurts a little bit Pain Location: left hip Pain Descriptors / Indicators: Aching;Sore;Operative site guarding Pain Intervention(s): Monitored during session;Repositioned;Ice applied    Home Living                      Prior Function  PT Goals (current goals can now be found in the care plan section) Progress towards PT goals: Progressing toward goals    Frequency    7X/week      PT Plan Current plan remains appropriate    Co-evaluation              AM-PAC PT "6 Clicks" Mobility   Outcome Measure  Help needed turning from your back to your side while in a flat bed without using bedrails?: A Little Help needed moving from lying on your back to sitting on the side of a flat bed without using bedrails?: A Little Help needed moving to and from a bed to a chair (including a wheelchair)?: A Little Help needed standing up from a chair using your arms (e.g., wheelchair or bedside chair)?: A Little Help needed to walk in hospital room?: A Little Help needed climbing 3-5 steps with a railing? : A Lot 6 Click Score: 17    End of Session Equipment Utilized During Treatment: Gait belt Activity Tolerance: Patient tolerated treatment well Patient left: in chair;with call bell/phone within reach;with chair alarm  set Nurse Communication: Mobility status (elevated BP) PT Visit Diagnosis: Difficulty in walking, not elsewhere classified (R26.2)     Time: 3267-1245 PT Time Calculation (min) (ACUTE ONLY): 25 min  Charges:  $Therapeutic Activity: 23-37 mins                     Felecia Shelling  PTA Acute  Rehabilitation Services Pager      [phone] Office      [phone]

## 2020-02-21 NOTE — Progress Notes (Signed)
Patient had yellow MEWS when PT was working with them, upon recheck, her BP was normal and medications were held.

## 2020-02-21 NOTE — Progress Notes (Signed)
Physical Therapy Treatment Patient Details Name: Wendy Lewis MRN: [id number] DOB: [birth year] Today's Date: 02/21/2020    History of Present Illness 79 y.o. female with history of hypertension anemia and cerebral palsy.  She had a fall at home when she was trying to avoid a frog in the kitchen.  xray= L hip fx. pt is s/p R DA THA per Dr Charlann Boxer on 02/18/20    PT Comments    POD # 3 General Gait Details: assisted with amb to and from bathroom.  Pt present with increased stability and ability to safely amb at CLOSE Supervision.  No c/o dizziness.  "I feel better than I did earlier"General Gait Details: assisted with amb to and from bathroom.  Pt present with increased stability and ability to safely amb at CLOSE Supervision.  No c/o dizziness.  "I feel better than I did earlier"  Assisted to recliner as both daughters returned from lunch.  Pt performing at a safe mobility level to return home with family support. Addressed all mobility questions, discussed appropriate activity, educated on use of ICE.  Pt ready for D/C to home.   Follow Up Recommendations  Home health PT;Follow surgeon's recommendation for DC plan and follow-up therapies     Equipment Recommendations  Rolling walker with 5" wheels;3in1 (PT)    Recommendations for Other Services       Precautions / Restrictions      Mobility  Bed Mobility               General bed mobility comments: OOB in recliner  Transfers Overall transfer level: Needs assistance Equipment used: Rolling walker (2 wheeled) Transfers: Sit to/from Stand;Stand Pivot Transfers Sit to Stand: Supervision Stand pivot transfers: Supervision       General transfer comment: assisted from recliner to amb to bathroom and also assisted in bathroom with a toilet transfer.  25% VC's on proper hand placement to avoid pulling up on walker.  Ambulation/Gait Ambulation/Gait assistance: Supervision Gait Distance (Feet): 40 Feet (20 feet x 2 to and  from bathroom) Assistive device: Rolling walker (2 wheeled) Gait Pattern/deviations: Step-to pattern;Decreased dorsiflexion - left;Decreased stance time - left Gait velocity: decreased   General Gait Details: assisted with amb to and from bathroom.  Pt present with increased stability and ability to safely amb at CLOSE Supervision.  No c/o dizziness.  "I feel better than I did earlier"   Stairs  Wheelchair Mobility    Modified Rankin (Stroke Patients Only)       Balance                                            Cognition Arousal/Alertness: Awake/alert Behavior During Therapy: WFL for tasks assessed/performed Overall Cognitive Status: Within Functional Limits for tasks assessed                                 General Comments: AxO x 3 very pleasant      Exercises      General Comments        Pertinent Vitals/Pain      Home Living                      Prior Function            PT Goals (current goals  can now be found in the care plan section) Progress towards PT goals: Progressing toward goals    Frequency    7X/week      PT Plan Current plan remains appropriate    Co-evaluation              AM-PAC PT "6 Clicks" Mobility   Outcome Measure  Help needed turning from your back to your side while in a flat bed without using bedrails?: A Little Help needed moving from lying on your back to sitting on the side of a flat bed without using bedrails?: A Little Help needed moving to and from a bed to a chair (including a wheelchair)?: A Little Help needed standing up from a chair using your arms (e.g., wheelchair or bedside chair)?: A Little Help needed to walk in hospital room?: A Little Help needed climbing 3-5 steps with a railing? : A Lot 6 Click Score: 17    End of Session Equipment Utilized During Treatment: Gait belt Activity Tolerance: Other (comment) (dizzy) Patient left: in chair;with call  bell/phone within reach;with chair alarm set Nurse Communication: Mobility status PT Visit Diagnosis: Difficulty in walking, not elsewhere classified (R26.2)     Time: 6811-5726 PT Time Calculation (min) (ACUTE ONLY): 16 min  Charges:  $Gait Training: 8-22 mins                     Felecia Shelling  PTA Acute  Rehabilitation Services Pager      [phone] Office      [phone]

## 2020-05-22 DIAGNOSIS — Z1231 Encounter for screening mammogram for malignant neoplasm of breast: Secondary | ICD-10-CM | POA: Diagnosis not present

## 2020-06-16 ENCOUNTER — Encounter: Payer: Medicare Other | Admitting: Obstetrics and Gynecology

## 2020-06-25 ENCOUNTER — Encounter: Payer: Self-pay | Admitting: Obstetrics and Gynecology

## 2020-06-25 ENCOUNTER — Ambulatory Visit: Payer: Medicare Other | Admitting: Obstetrics and Gynecology

## 2020-06-25 ENCOUNTER — Other Ambulatory Visit: Payer: Self-pay

## 2020-06-25 VITALS — BP 142/88 | HR 90 | Ht 58.66 in | Wt 97.0 lb

## 2020-06-25 DIAGNOSIS — L900 Lichen sclerosus et atrophicus: Secondary | ICD-10-CM

## 2020-06-25 DIAGNOSIS — Z01419 Encounter for gynecological examination (general) (routine) without abnormal findings: Secondary | ICD-10-CM | POA: Diagnosis not present

## 2020-06-25 DIAGNOSIS — M8580 Other specified disorders of bone density and structure, unspecified site: Secondary | ICD-10-CM

## 2020-06-25 NOTE — Progress Notes (Signed)
   HAANI BAKULA [birth year] [id number]  SUBJECTIVE:  80 y.o. G2P2002 female for annual routine gynecologic exam. She has no gynecologic concerns.  Current Outpatient Medications  Medication Sig Dispense Refill  . alendronate (FOSAMAX) 70 MG tablet Take by mouth once a week.    . Cholecalciferol (VITAMIN D PO) Take 1 tablet by mouth daily.     . clobetasol cream (TEMOVATE) 0.05 % Apply 1 application topically 2 (two) times daily. 30 g 0  . lisinopril-hydrochlorothiazide (ZESTORETIC) 10-12.5 MG tablet Take 1 tablet by mouth daily.    . Multiple Vitamin (MULTIVITAMIN WITH MINERALS) TABS tablet Take 1 tablet by mouth daily.     No current facility-administered medications for this visit.   Allergies: Fentanyl and related  No LMP recorded. Patient has had a hysterectomy.  Past medical history,surgical history, problem list, medications, allergies, family history and social history were all reviewed and documented as reviewed in the EPIC chart.  ROS: Pertinent positives and negatives as reviewed in HPI  OBJECTIVE:  BP (!) 142/88 (BP Location: Right Arm, Patient Position: Sitting)   Pulse 90   Ht 4' 10.66" (1.49 m)   Wt 97 lb (44 kg)   BMI 19.82 kg/m  The patient appears well, alert, oriented, in no distress.  BREAST EXAM: breasts appear normal, no suspicious masses, no skin or nipple changes or axillary nodes  PELVIC EXAM: VULVA: normal appearing vulva with no masses, tenderness or lesions,  'parchment paper changes' consistent with lichen sclerosus, atrophy noted.  VAGINA: normal appearing vagina with normal color and discharge, no lesions, atrophic changes.  CERVIX: surgically absent, UTERUS: surgically absent, vaginal cuff normal, ADNEXA: no masses, non tender RECTAL: normal rectal, no masses  Chaperone: Jiill Hamm RN present during the examination  ASSESSMENT:  80 y.o. [id number] here for annual gynecologic exam  PLAN:   1. Postmenopausal.  Prior TAH for cervical dysplasia  several years ago.  No gynecologic concerns today. 2. Lichen sclerosus. She uses clobetasol 0.05% cream seldomly and has no concerns at this time.  Will notify us if she needs a refill. 3. Pap smear 2018. Not repeated today. History of hysterectomy for dysplasia several years ago. She is comfortable with not further screening based on age criteria and several normal Pap test results since the hysterectomy. 4. Mammogram 04/2020. Will continue with annual mammography. Breast exam normal today. 5. Colonoscopy 2019. Recommended that she continue per the prescribed interval.   6. Osteopenia.  DEXA 10/2019 with slight improvement in T score -1.4, FRAX 10% / 2.3%. Dr. Cliffton Asters started her on Fosamax after her hip fracture.  Repeat DEXA at 2-year interval. 7. Health maintenance.  No lab work as she has this completed elsewhere. The patient is aware that I will only be at this practice until early March 2022 so she knows to make sure she requests follow-up on results for any medical tests that she does when I am no longer at the practice.   Return annually or sooner, prn.  Theresia Majors MD  06/25/20

## 2020-08-04 DIAGNOSIS — I129 Hypertensive chronic kidney disease with stage 1 through stage 4 chronic kidney disease, or unspecified chronic kidney disease: Secondary | ICD-10-CM | POA: Diagnosis not present

## 2020-08-04 DIAGNOSIS — G809 Cerebral palsy, unspecified: Secondary | ICD-10-CM | POA: Diagnosis not present

## 2020-08-04 DIAGNOSIS — M810 Age-related osteoporosis without current pathological fracture: Secondary | ICD-10-CM | POA: Diagnosis not present

## 2020-08-04 DIAGNOSIS — N183 Chronic kidney disease, stage 3 unspecified: Secondary | ICD-10-CM | POA: Diagnosis not present

## 2021-01-27 DIAGNOSIS — Z135 Encounter for screening for eye and ear disorders: Secondary | ICD-10-CM | POA: Diagnosis not present

## 2021-01-27 DIAGNOSIS — H52223 Regular astigmatism, bilateral: Secondary | ICD-10-CM | POA: Diagnosis not present

## 2021-01-27 DIAGNOSIS — Z961 Presence of intraocular lens: Secondary | ICD-10-CM | POA: Diagnosis not present

## 2021-01-27 DIAGNOSIS — H524 Presbyopia: Secondary | ICD-10-CM | POA: Diagnosis not present

## 2021-02-06 DIAGNOSIS — M19042 Primary osteoarthritis, left hand: Secondary | ICD-10-CM | POA: Diagnosis not present

## 2021-02-06 DIAGNOSIS — N183 Chronic kidney disease, stage 3 unspecified: Secondary | ICD-10-CM | POA: Diagnosis not present

## 2021-02-06 DIAGNOSIS — J302 Other seasonal allergic rhinitis: Secondary | ICD-10-CM | POA: Diagnosis not present

## 2021-02-06 DIAGNOSIS — E785 Hyperlipidemia, unspecified: Secondary | ICD-10-CM | POA: Diagnosis not present

## 2021-02-06 DIAGNOSIS — I129 Hypertensive chronic kidney disease with stage 1 through stage 4 chronic kidney disease, or unspecified chronic kidney disease: Secondary | ICD-10-CM | POA: Diagnosis not present

## 2021-02-06 DIAGNOSIS — G809 Cerebral palsy, unspecified: Secondary | ICD-10-CM | POA: Diagnosis not present

## 2021-02-06 DIAGNOSIS — Z0000 Encounter for general adult medical examination without abnormal findings: Secondary | ICD-10-CM | POA: Diagnosis not present

## 2021-02-06 DIAGNOSIS — Z23 Encounter for immunization: Secondary | ICD-10-CM | POA: Diagnosis not present

## 2021-02-06 DIAGNOSIS — M8588 Other specified disorders of bone density and structure, other site: Secondary | ICD-10-CM | POA: Diagnosis not present

## 2021-02-06 DIAGNOSIS — D638 Anemia in other chronic diseases classified elsewhere: Secondary | ICD-10-CM | POA: Diagnosis not present

## 2021-04-26 IMAGING — CR DG CHEST 1V
1 series · 1 of 1 positions shown · non-contrast
Comparison: June 29, 2017

CLINICAL DATA: Fall

EXAM:
CHEST  1 VIEW

[x chest ap]
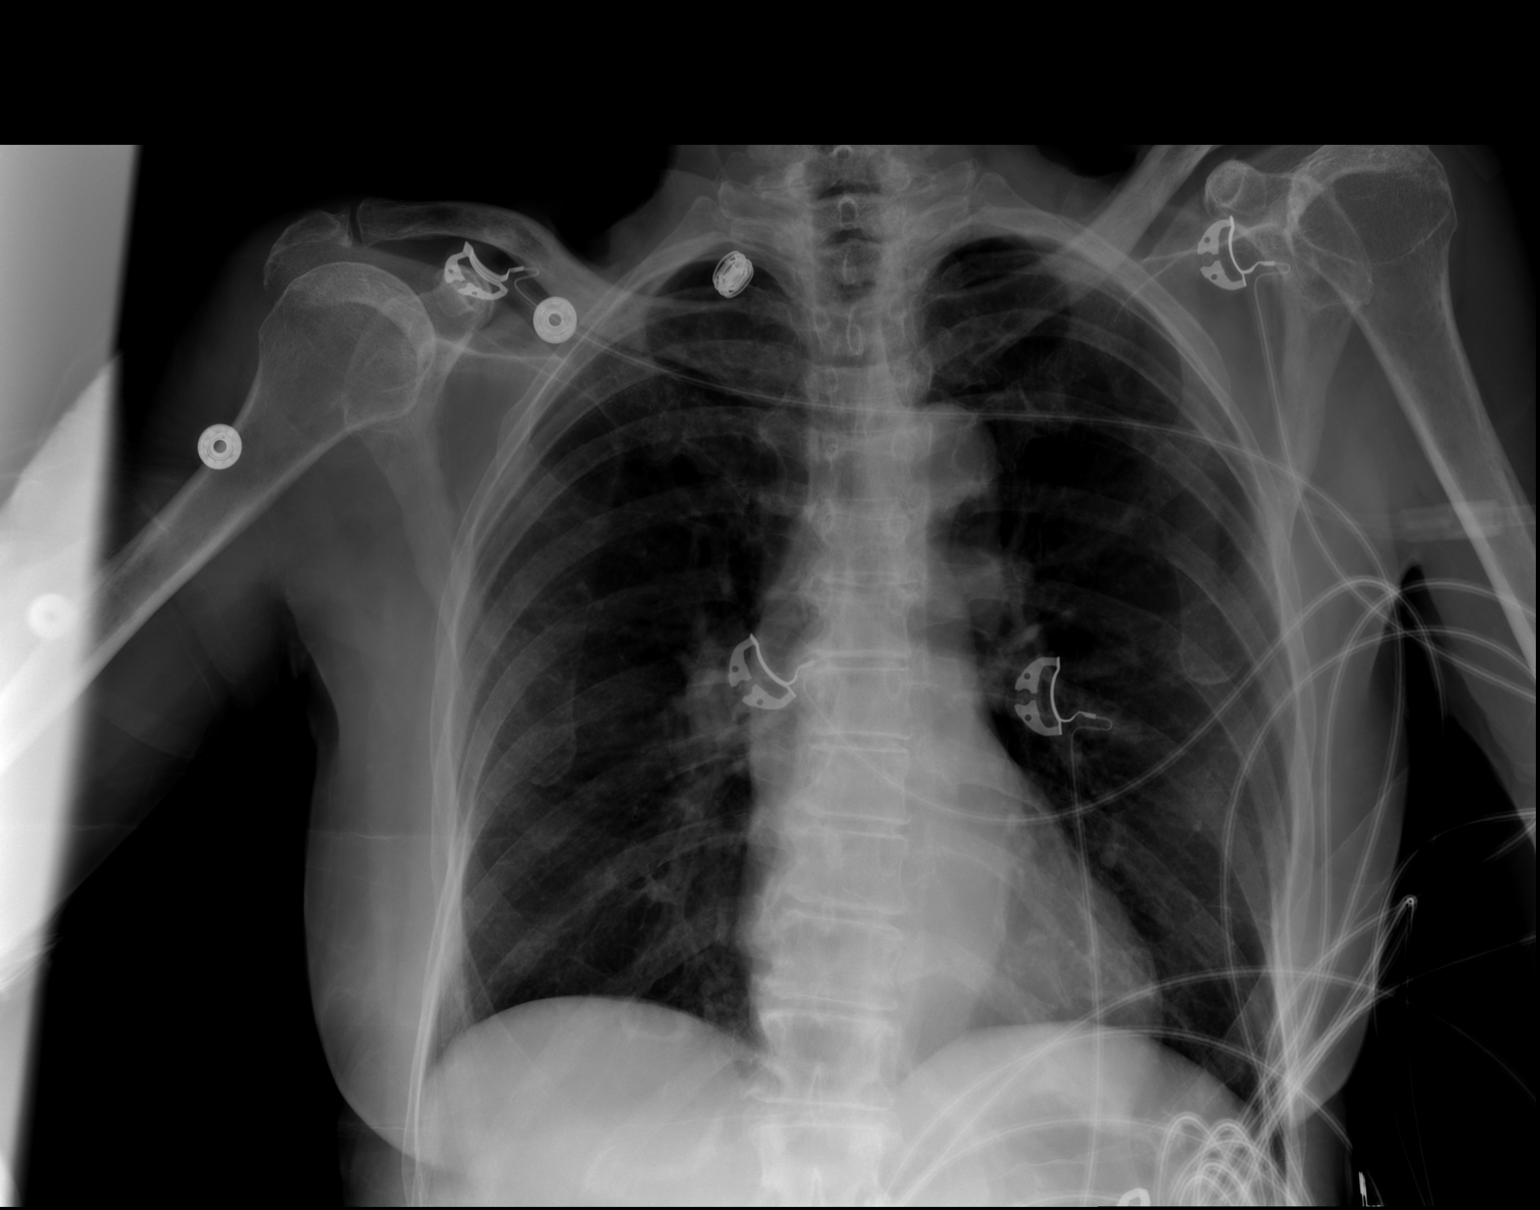

[1 of 1 positions shown; findings below may reference images not displayed]

FINDINGS: The heart size and mediastinal contours are within normal limits.
Both lungs are clear. The visualized skeletal structures are
unremarkable.
IMPRESSION: No active disease.

## 2021-04-26 IMAGING — DX DG PORTABLE PELVIS
1 series · 1 of 1 positions shown · non-contrast
Comparison: Intraoperative films from earlier in the same day.

CLINICAL DATA: Status post left hip replacement

EXAM:
PORTABLE PELVIS 1-2 VIEWS

[pelvis ap]
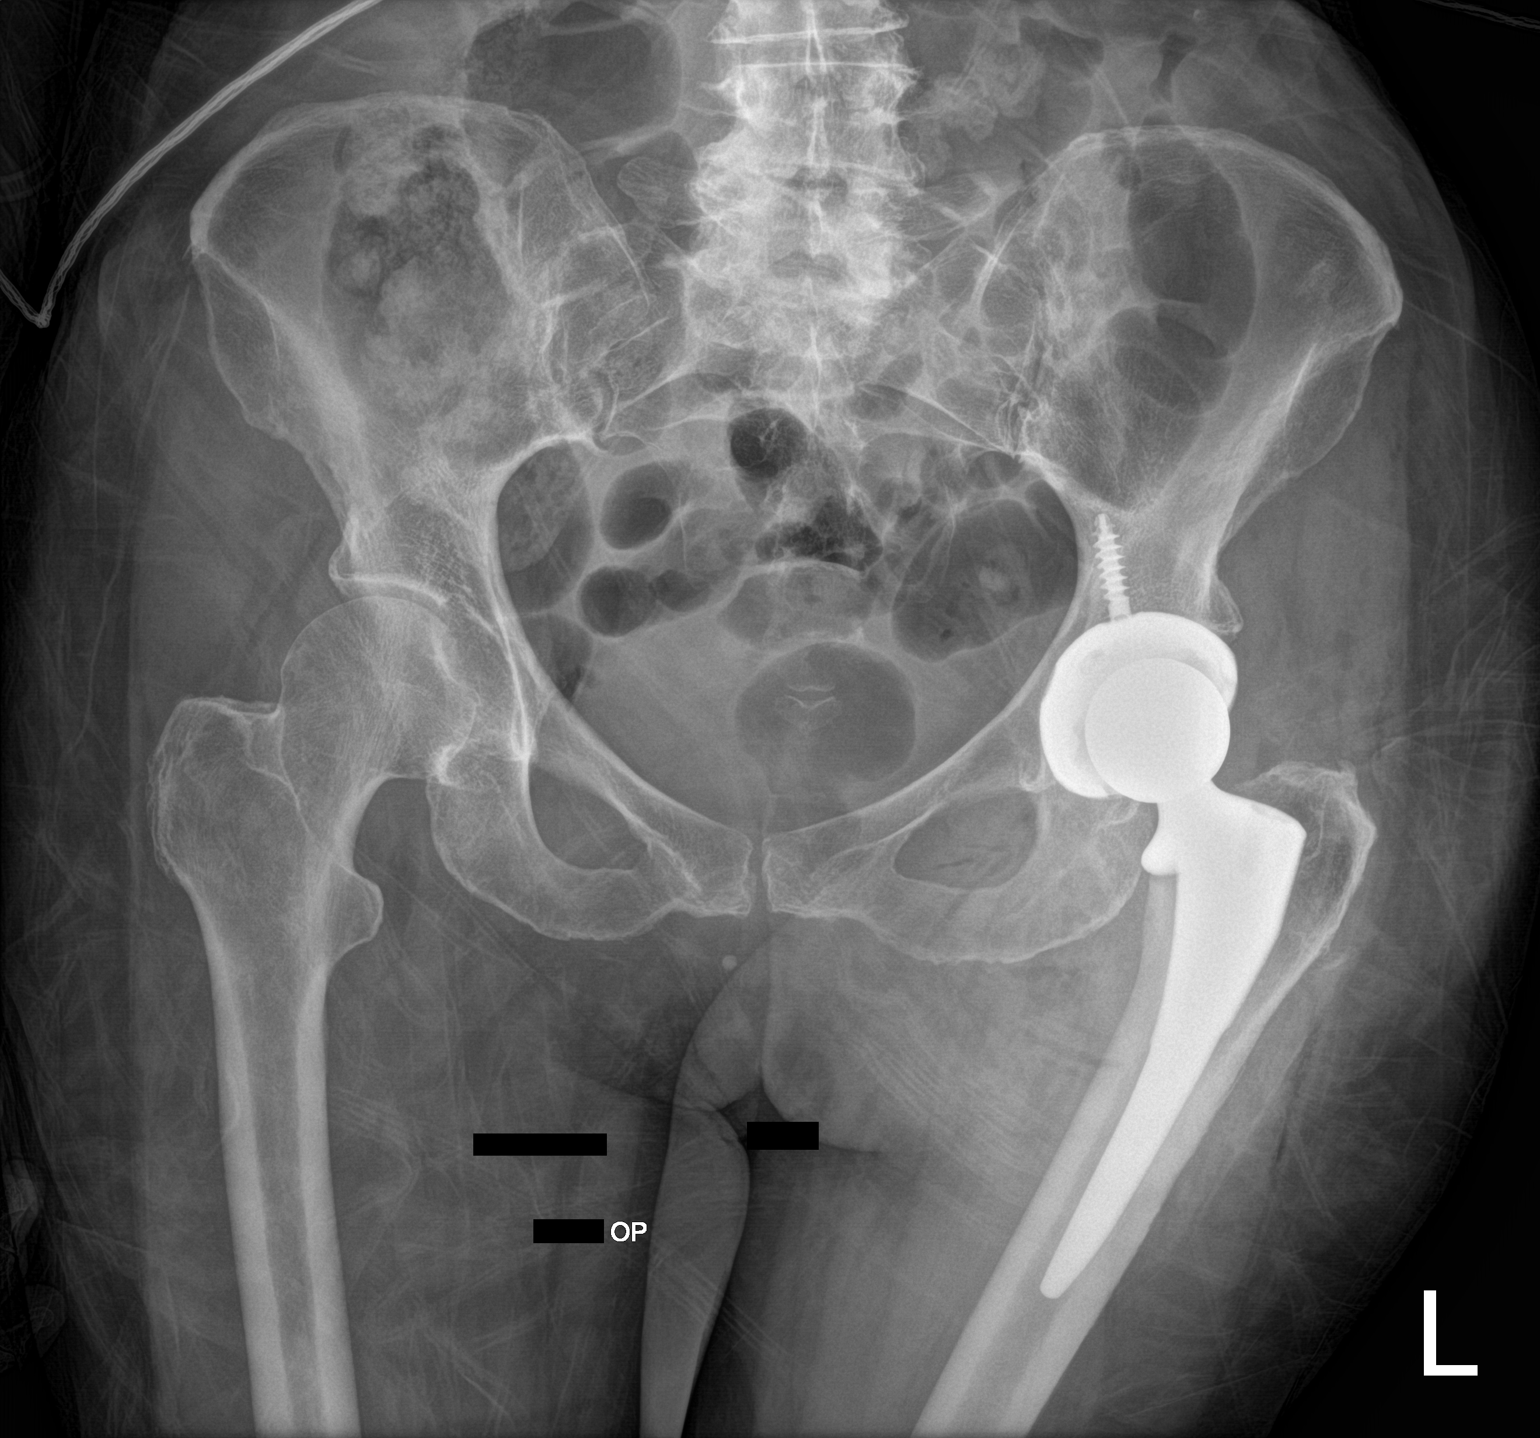

[1 of 1 positions shown; findings below may reference images not displayed]

FINDINGS: Left hip prosthesis is now seen. No other acute bony abnormality is
noted. Degenerative changes of lumbar spine are seen. No soft tissue
abnormality is seen.
IMPRESSION: Status post left hip replacement

## 2021-04-26 IMAGING — CR DG HIP (WITH OR WITHOUT PELVIS) 1V*L*
3 series · 3 of 3 positions shown · non-contrast
Comparison: None.

CLINICAL DATA: Fall and pain

EXAM:
DG HIP (WITH OR WITHOUT PELVIS) 1V*L*

[x pelvis (1 of 3)]
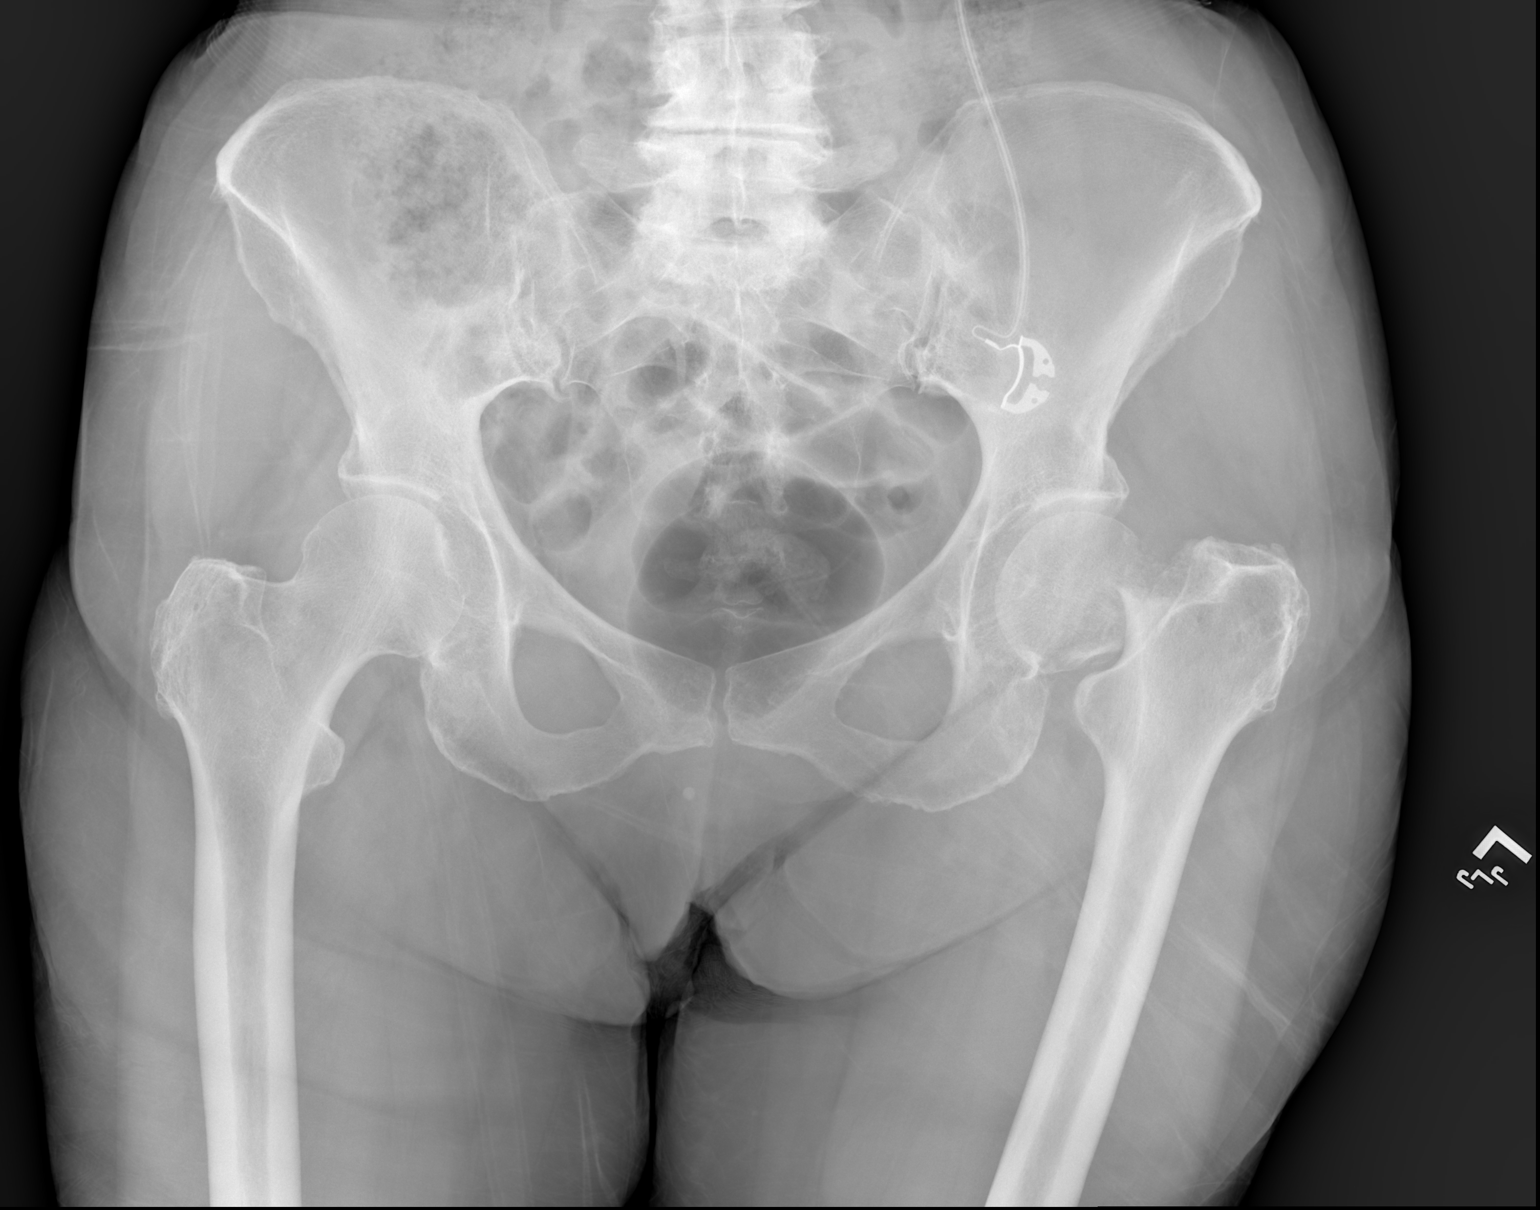

[x pelvis (2 of 3)]
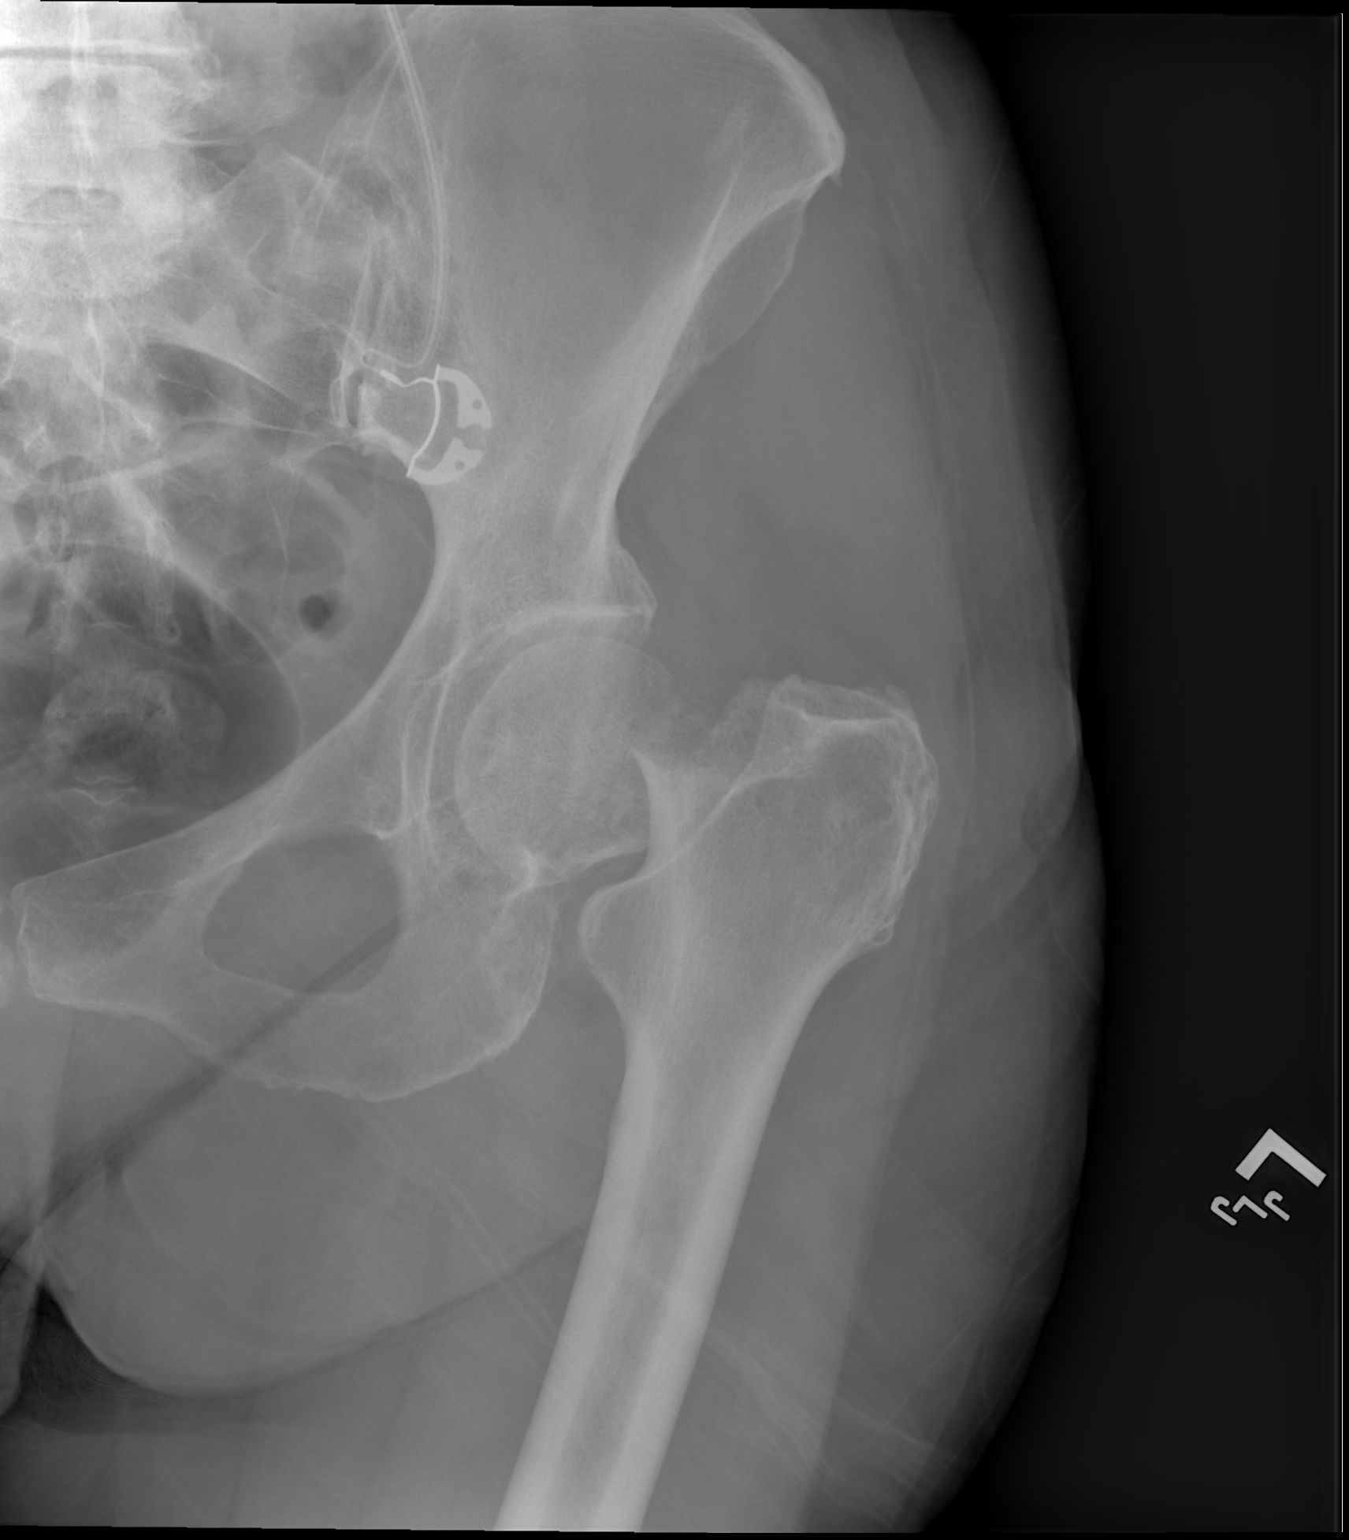

[x pelvis (3 of 3)]
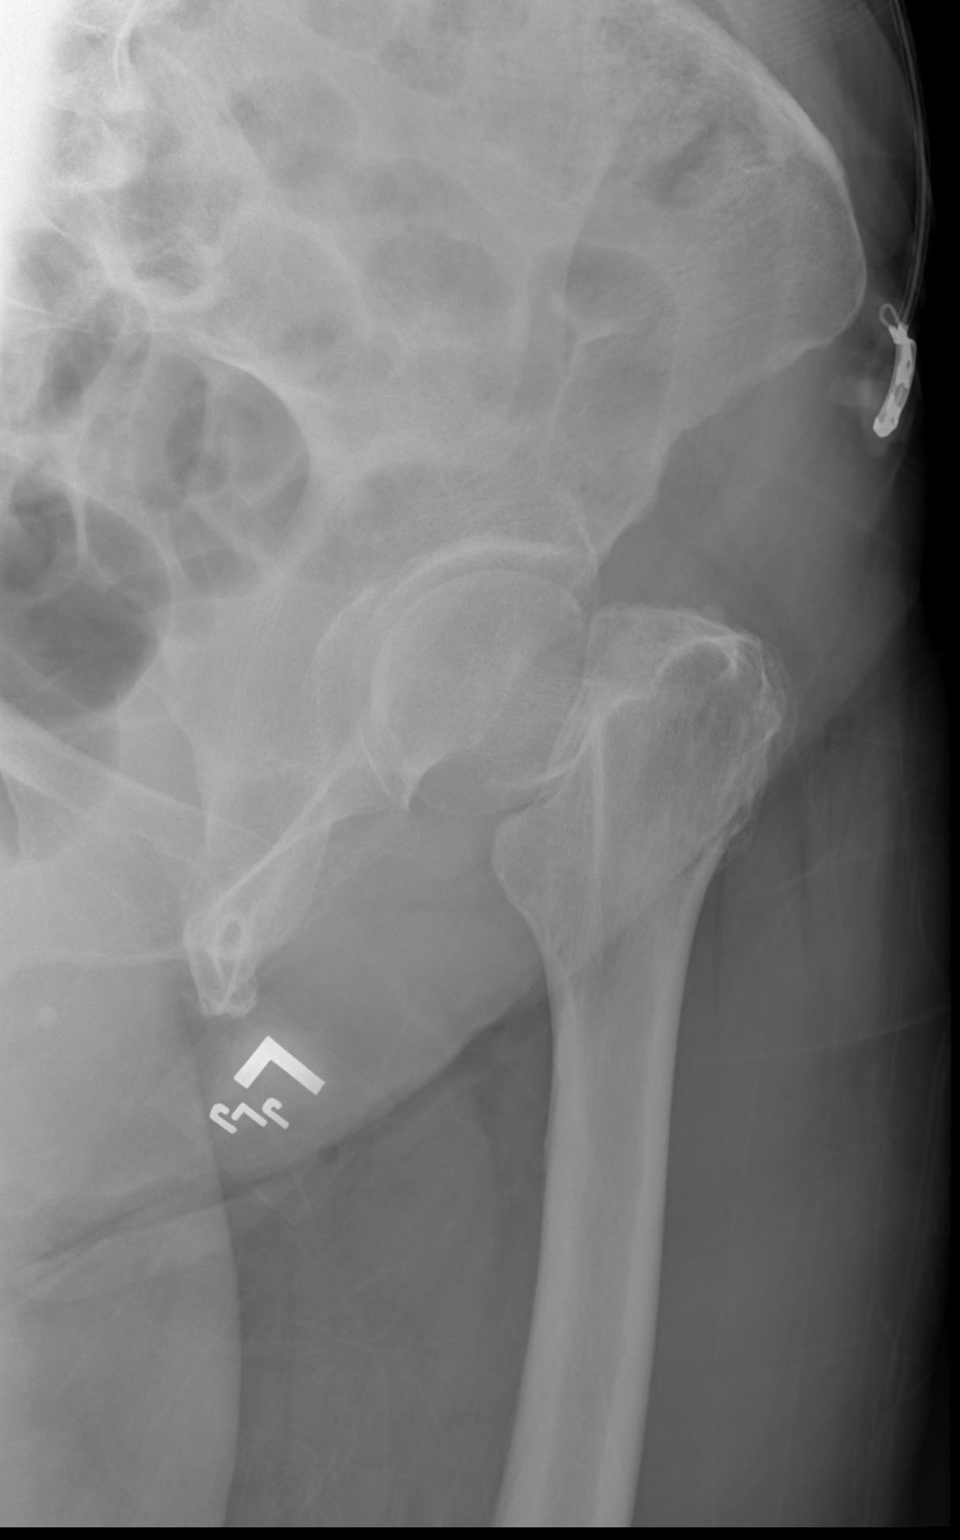

[3 of 3 positions shown; findings below may reference images not displayed]

FINDINGS: There is a comminuted mildly displaced fracture of the transcervical
left femoral neck. There is superior subluxation of the femoral
shaft. The femoral head is still well seated within the acetabulum.
No other definite fracture seen.
IMPRESSION: Mildly displaced left transcervical femoral neck fracture.

## 2021-04-29 DIAGNOSIS — Z96642 Presence of left artificial hip joint: Secondary | ICD-10-CM | POA: Diagnosis not present

## 2021-05-28 DIAGNOSIS — Z1231 Encounter for screening mammogram for malignant neoplasm of breast: Secondary | ICD-10-CM | POA: Diagnosis not present

## 2021-07-16 NOTE — Progress Notes (Signed)
81 y.o. G58P2002 Widowed Caucasian female here for breast and pelvic exam.   ? ?Hx of cervical dysplasia and lichen sclerosus. ?She uses Clobetasol cream.  ? ?She is followed for osteopenia and has her BMD at this office.  ?Her PCP prescribes Fosamax. ? ?She does state history of chronic renal disease.  ?Report her level is at a "3." ? ?PCP:  [name], MD  ? ?No LMP recorded. Patient has had a hysterectomy.     ?  ?    ?Sexually active: No.  ?The current method of family planning is status post hysterectomy for cervical dysplasis years ago.    ?Exercising: Yes.     Walks 20 minutes daily inside ?Smoker:  Former ? ?Health Maintenance: ?Pap:  05-19-16 Neg, 05-01-13 Neg ?History of abnormal Pap:  yes, Had Hyst for cervical dysplasia over 30 years ago ?MMG:  06/2021 normal per patient.    ?Colonoscopy:  2019 normal per patient--has aged out ?BMD:  10-30-19 at this office.   Result :Osteopenia--on Fosamax ?TDaP:  10-21-15 ?Gardasil:   n/a ?HIV:no ?Hep C:no ?Screening Labs:  PCP ? ? reports that she has quit smoking. She has never used smokeless tobacco. She reports that she does not drink alcohol and does not use drugs. ? ?Past Medical History:  ?Diagnosis Date  ? Bursitis   ? Chronic kidney disease   ? CP (cerebral palsy) (HCC)   ? Hypertension   ? Lichen sclerosus 05/2015  ? vulvar biopsy  ? Osteopenia 10/2017  ? T score -1.5 FRAX 9.6% / 2.1%.  Stable from prior DEXA.  ? 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 ? ?Past Surgical History:  ?Procedure Laterality Date  ? ABDOMINAL HYSTERECTOMY  1975  ? TAH for cervical dysplasia  ? CATARACT EXTRACTION    ? Bilateral  ? TONSILLECTOMY    ? TOTAL HIP ARTHROPLASTY Left 02/18/2020  ? Procedure: TOTAL HIP ARTHROPLASTY ANTERIOR APPROACH;  Surgeon: Durene Romans, MD;  Location: [facility];  Service: Orthopedics;  Laterality: Left;  ? ? ?Current Outpatient Medications  ?Medication Sig Dispense Refill  ? alendronate (FOSAMAX) 70 MG tablet Take by mouth once a week.    ? Cholecalciferol  (VITAMIN D PO) Take 1 tablet by mouth daily.     ? lisinopril-hydrochlorothiazide (ZESTORETIC) 20-25 MG tablet 1 tablet    ? Multiple Vitamin (MULTIVITAMIN WITH MINERALS) TABS tablet Take 1 tablet by mouth daily.    ? Polyethyl Glycol-Propyl Glycol (SYSTANE) 0.4-0.3 % SOLN See admin instructions.    ? clobetasol cream (TEMOVATE) 0.05 % Place the cream on the affected area twice a day for 2 weeks for a flare of symptoms. 30 g 1  ? ?No current facility-administered medications for this visit.  ? ? ?Family History  ?Problem Relation Age of Onset  ? Diabetes Mother   ? Hypertension Mother   ? Heart disease Mother   ? Cancer Sister   ?     THYROID  ? Cancer Paternal Grandmother   ?     UTERINE OR OVARIAN?  ? Hypertension Daughter   ? ? ?Review of Systems  ?All other systems reviewed and are negative. ? ?Exam:   ?BP 140/76   Ht 4' 10.25" (1.48 m)   Wt 98 lb (44.5 kg)   BMI 20.31 kg/m?     ?General appearance: alert, cooperative and appears stated age ?Head: normocephalic, without obvious abnormality, atraumatic ?Neck: no adenopathy, supple, symmetrical, trachea midline and thyroid normal to inspection and palpation ?  Lungs: clear to auscultation bilaterally ?Breasts: normal appearance, no masses or tenderness, No nipple retraction or dimpling, No nipple discharge or bleeding, No axillary adenopathy ?Heart: regular rate and rhythm ?Abdomen: soft, non-tender; no masses, no organomegaly ?Extremities: extremities normal, atraumatic, no cyanosis or edema ?Skin: skin color, texture, turgor normal. No rashes or lesions ?Lymph nodes: cervical, supraclavicular, and axillary nodes normal. ?Neurologic: grossly normal ? ?Pelvic: External genitalia:  no lesions ?             No abnormal inguinal nodes palpated. ?             Urethra:  normal appearing urethra with no masses, tenderness or lesions ?             Bartholins and Skenes: normal    ?             Vagina: normal appearing vagina with normal color and discharge, no lesions ?              Cervix:  absent ?             Pap taken: no ?Bimanual Exam:  Uterus:  absent ?             Adnexa: no mass, fullness, tenderness ?             Rectal exam: yes.  Confirms. ?             Anus:  normal sphincter tone, no lesions ? ?Chaperone was present for exam:  yes. ? ?Assessment:   ?Well woman visit with gynecologic exam. ?Status post TAH for cervical dysplasia in 1975.  Ovaries remain.  ?Lichen sclerosus.  ?Osteopenia of bilateral hips.  On Fosamax through PCP. ?Renal disease.  ? ?Plan: ?Mammogram screening discussed. ?Self breast awareness reviewed. ?Pap and HR HPV not indicated. ?Guidelines for Calcium, Vitamin D, regular exercise program including cardiovascular and weight bearing exercise. ?Rx for Temovate cream.  ?BMD from 2021 reviewed.  Next study due in June, 2023.  ?Will request a copy of patient's labs from her PCP. ?Follow up annually and prn.  ? ?20 min total time was spent for this patient encounter, including preparation, face-to-face counseling with the patient, coordination of care, and documentation of the encounter. ? ?After visit summary provided.  ? ? ? ?

## 2021-07-20 ENCOUNTER — Ambulatory Visit (INDEPENDENT_AMBULATORY_CARE_PROVIDER_SITE_OTHER): Payer: Medicare Other | Admitting: Obstetrics and Gynecology

## 2021-07-20 ENCOUNTER — Encounter: Payer: Self-pay | Admitting: Obstetrics and Gynecology

## 2021-07-20 ENCOUNTER — Other Ambulatory Visit: Payer: Self-pay

## 2021-07-20 VITALS — BP 140/76 | Ht 58.25 in | Wt 98.0 lb

## 2021-07-20 DIAGNOSIS — Z01419 Encounter for gynecological examination (general) (routine) without abnormal findings: Secondary | ICD-10-CM

## 2021-07-20 DIAGNOSIS — L900 Lichen sclerosus et atrophicus: Secondary | ICD-10-CM

## 2021-07-20 DIAGNOSIS — M8580 Other specified disorders of bone density and structure, unspecified site: Secondary | ICD-10-CM

## 2021-07-20 MED ORDER — CLOBETASOL PROPIONATE 0.05 % EX CREA: TOPICAL_CREAM | CUTANEOUS | 1 refills | Status: DC

## 2021-07-21 NOTE — Patient Instructions (Signed)

## 2021-08-07 ENCOUNTER — Other Ambulatory Visit: Payer: Self-pay

## 2021-08-07 DIAGNOSIS — Z13820 Encounter for screening for osteoporosis: Secondary | ICD-10-CM

## 2021-08-07 DIAGNOSIS — E785 Hyperlipidemia, unspecified: Secondary | ICD-10-CM | POA: Diagnosis not present

## 2021-08-07 DIAGNOSIS — I700 Atherosclerosis of aorta: Secondary | ICD-10-CM | POA: Diagnosis not present

## 2021-08-07 DIAGNOSIS — D638 Anemia in other chronic diseases classified elsewhere: Secondary | ICD-10-CM | POA: Diagnosis not present

## 2021-08-07 DIAGNOSIS — G809 Cerebral palsy, unspecified: Secondary | ICD-10-CM | POA: Diagnosis not present

## 2021-08-07 DIAGNOSIS — N183 Chronic kidney disease, stage 3 unspecified: Secondary | ICD-10-CM | POA: Diagnosis not present

## 2021-08-07 DIAGNOSIS — I129 Hypertensive chronic kidney disease with stage 1 through stage 4 chronic kidney disease, or unspecified chronic kidney disease: Secondary | ICD-10-CM | POA: Diagnosis not present

## 2021-08-11 ENCOUNTER — Encounter: Payer: Self-pay | Admitting: Obstetrics and Gynecology

## 2021-10-28 DIAGNOSIS — N183 Chronic kidney disease, stage 3 unspecified: Secondary | ICD-10-CM | POA: Diagnosis not present

## 2021-10-28 DIAGNOSIS — Z79899 Other long term (current) drug therapy: Secondary | ICD-10-CM | POA: Diagnosis not present

## 2021-10-28 DIAGNOSIS — E785 Hyperlipidemia, unspecified: Secondary | ICD-10-CM | POA: Diagnosis not present

## 2021-11-10 ENCOUNTER — Other Ambulatory Visit: Payer: Self-pay | Admitting: Obstetrics and Gynecology

## 2021-11-10 ENCOUNTER — Ambulatory Visit (INDEPENDENT_AMBULATORY_CARE_PROVIDER_SITE_OTHER): Payer: Medicare Other

## 2021-11-10 DIAGNOSIS — M85851 Other specified disorders of bone density and structure, right thigh: Secondary | ICD-10-CM

## 2021-11-10 DIAGNOSIS — Z780 Asymptomatic menopausal state: Secondary | ICD-10-CM

## 2021-11-10 DIAGNOSIS — Z13820 Encounter for screening for osteoporosis: Secondary | ICD-10-CM

## 2022-02-16 DIAGNOSIS — G809 Cerebral palsy, unspecified: Secondary | ICD-10-CM | POA: Diagnosis not present

## 2022-02-16 DIAGNOSIS — I129 Hypertensive chronic kidney disease with stage 1 through stage 4 chronic kidney disease, or unspecified chronic kidney disease: Secondary | ICD-10-CM | POA: Diagnosis not present

## 2022-02-16 DIAGNOSIS — E785 Hyperlipidemia, unspecified: Secondary | ICD-10-CM | POA: Diagnosis not present

## 2022-02-16 DIAGNOSIS — I700 Atherosclerosis of aorta: Secondary | ICD-10-CM | POA: Diagnosis not present

## 2022-02-16 DIAGNOSIS — N183 Chronic kidney disease, stage 3 unspecified: Secondary | ICD-10-CM | POA: Diagnosis not present

## 2022-02-16 DIAGNOSIS — Z0000 Encounter for general adult medical examination without abnormal findings: Secondary | ICD-10-CM | POA: Diagnosis not present

## 2022-02-16 DIAGNOSIS — Z23 Encounter for immunization: Secondary | ICD-10-CM | POA: Diagnosis not present

## 2022-02-16 DIAGNOSIS — M810 Age-related osteoporosis without current pathological fracture: Secondary | ICD-10-CM | POA: Diagnosis not present

## 2022-02-25 ENCOUNTER — Other Ambulatory Visit (HOSPITAL_BASED_OUTPATIENT_CLINIC_OR_DEPARTMENT_OTHER): Payer: Self-pay

## 2022-02-25 MED ORDER — COVID-19 MRNA 2023-2024 VACCINE (COMIRNATY) 0.3 ML INJECTION
INTRAMUSCULAR | 0 refills | Status: AC
  Filled 2022-02-25: qty 0.3, 1d supply, fill #0

## 2022-04-05 ENCOUNTER — Other Ambulatory Visit (HOSPITAL_BASED_OUTPATIENT_CLINIC_OR_DEPARTMENT_OTHER): Payer: Self-pay

## 2022-04-05 MED ORDER — AREXVY 120 MCG/0.5ML IM SUSR
INTRAMUSCULAR | 0 refills | Status: AC
  Filled 2022-04-05: qty 0.5, 1d supply, fill #0

## 2022-05-31 DIAGNOSIS — Z1231 Encounter for screening mammogram for malignant neoplasm of breast: Secondary | ICD-10-CM | POA: Diagnosis not present

## 2022-08-23 DIAGNOSIS — I700 Atherosclerosis of aorta: Secondary | ICD-10-CM | POA: Diagnosis not present

## 2022-08-23 DIAGNOSIS — N183 Chronic kidney disease, stage 3 unspecified: Secondary | ICD-10-CM | POA: Diagnosis not present

## 2022-08-23 DIAGNOSIS — E441 Mild protein-calorie malnutrition: Secondary | ICD-10-CM | POA: Diagnosis not present

## 2022-08-23 DIAGNOSIS — I129 Hypertensive chronic kidney disease with stage 1 through stage 4 chronic kidney disease, or unspecified chronic kidney disease: Secondary | ICD-10-CM | POA: Diagnosis not present

## 2022-08-23 DIAGNOSIS — E785 Hyperlipidemia, unspecified: Secondary | ICD-10-CM | POA: Diagnosis not present

## 2022-08-23 DIAGNOSIS — G809 Cerebral palsy, unspecified: Secondary | ICD-10-CM | POA: Diagnosis not present

## 2022-08-23 DIAGNOSIS — M810 Age-related osteoporosis without current pathological fracture: Secondary | ICD-10-CM | POA: Diagnosis not present

## 2023-01-10 DIAGNOSIS — L820 Inflamed seborrheic keratosis: Secondary | ICD-10-CM | POA: Diagnosis not present

## 2023-03-03 DIAGNOSIS — G809 Cerebral palsy, unspecified: Secondary | ICD-10-CM | POA: Diagnosis not present

## 2023-03-03 DIAGNOSIS — R0609 Other forms of dyspnea: Secondary | ICD-10-CM | POA: Diagnosis not present

## 2023-03-03 DIAGNOSIS — Z0000 Encounter for general adult medical examination without abnormal findings: Secondary | ICD-10-CM | POA: Diagnosis not present

## 2023-03-03 DIAGNOSIS — M810 Age-related osteoporosis without current pathological fracture: Secondary | ICD-10-CM | POA: Diagnosis not present

## 2023-03-03 DIAGNOSIS — I700 Atherosclerosis of aorta: Secondary | ICD-10-CM | POA: Diagnosis not present

## 2023-03-03 DIAGNOSIS — I129 Hypertensive chronic kidney disease with stage 1 through stage 4 chronic kidney disease, or unspecified chronic kidney disease: Secondary | ICD-10-CM | POA: Diagnosis not present

## 2023-03-03 DIAGNOSIS — E785 Hyperlipidemia, unspecified: Secondary | ICD-10-CM | POA: Diagnosis not present

## 2023-03-03 DIAGNOSIS — Z23 Encounter for immunization: Secondary | ICD-10-CM | POA: Diagnosis not present

## 2023-03-03 DIAGNOSIS — J310 Chronic rhinitis: Secondary | ICD-10-CM | POA: Diagnosis not present

## 2023-03-03 DIAGNOSIS — N183 Chronic kidney disease, stage 3 unspecified: Secondary | ICD-10-CM | POA: Diagnosis not present

## 2023-06-02 DIAGNOSIS — Z1231 Encounter for screening mammogram for malignant neoplasm of breast: Secondary | ICD-10-CM | POA: Diagnosis not present

## 2023-06-06 ENCOUNTER — Encounter: Payer: Self-pay | Admitting: Obstetrics and Gynecology

## 2023-06-08 ENCOUNTER — Encounter: Payer: Self-pay | Admitting: Obstetrics and Gynecology

## 2023-06-30 DIAGNOSIS — R509 Fever, unspecified: Secondary | ICD-10-CM | POA: Diagnosis not present

## 2023-06-30 DIAGNOSIS — R051 Acute cough: Secondary | ICD-10-CM | POA: Diagnosis not present

## 2023-06-30 DIAGNOSIS — R0981 Nasal congestion: Secondary | ICD-10-CM | POA: Diagnosis not present

## 2023-08-16 DIAGNOSIS — R053 Chronic cough: Secondary | ICD-10-CM | POA: Diagnosis not present

## 2023-08-24 DIAGNOSIS — R052 Subacute cough: Secondary | ICD-10-CM | POA: Diagnosis not present

## 2023-08-24 DIAGNOSIS — I129 Hypertensive chronic kidney disease with stage 1 through stage 4 chronic kidney disease, or unspecified chronic kidney disease: Secondary | ICD-10-CM | POA: Diagnosis not present

## 2023-08-24 DIAGNOSIS — K219 Gastro-esophageal reflux disease without esophagitis: Secondary | ICD-10-CM | POA: Diagnosis not present

## 2023-08-24 DIAGNOSIS — E441 Mild protein-calorie malnutrition: Secondary | ICD-10-CM | POA: Diagnosis not present

## 2023-09-08 DIAGNOSIS — N183 Chronic kidney disease, stage 3 unspecified: Secondary | ICD-10-CM | POA: Diagnosis not present

## 2023-09-08 DIAGNOSIS — K219 Gastro-esophageal reflux disease without esophagitis: Secondary | ICD-10-CM | POA: Diagnosis not present

## 2023-09-08 DIAGNOSIS — I129 Hypertensive chronic kidney disease with stage 1 through stage 4 chronic kidney disease, or unspecified chronic kidney disease: Secondary | ICD-10-CM | POA: Diagnosis not present

## 2023-09-08 DIAGNOSIS — M810 Age-related osteoporosis without current pathological fracture: Secondary | ICD-10-CM | POA: Diagnosis not present

## 2023-09-08 DIAGNOSIS — I700 Atherosclerosis of aorta: Secondary | ICD-10-CM | POA: Diagnosis not present

## 2023-09-08 DIAGNOSIS — G809 Cerebral palsy, unspecified: Secondary | ICD-10-CM | POA: Diagnosis not present

## 2023-09-08 DIAGNOSIS — H6122 Impacted cerumen, left ear: Secondary | ICD-10-CM | POA: Diagnosis not present

## 2023-09-08 DIAGNOSIS — E785 Hyperlipidemia, unspecified: Secondary | ICD-10-CM | POA: Diagnosis not present

## 2023-09-08 DIAGNOSIS — R052 Subacute cough: Secondary | ICD-10-CM | POA: Diagnosis not present

## 2023-10-12 ENCOUNTER — Encounter: Payer: Medicare Other | Admitting: Obstetrics and Gynecology

## 2023-10-15 DIAGNOSIS — M810 Age-related osteoporosis without current pathological fracture: Secondary | ICD-10-CM | POA: Diagnosis not present

## 2023-10-15 DIAGNOSIS — I129 Hypertensive chronic kidney disease with stage 1 through stage 4 chronic kidney disease, or unspecified chronic kidney disease: Secondary | ICD-10-CM | POA: Diagnosis not present

## 2023-10-15 DIAGNOSIS — N183 Chronic kidney disease, stage 3 unspecified: Secondary | ICD-10-CM | POA: Diagnosis not present

## 2023-10-15 DIAGNOSIS — E785 Hyperlipidemia, unspecified: Secondary | ICD-10-CM | POA: Diagnosis not present

## 2023-11-14 DIAGNOSIS — E785 Hyperlipidemia, unspecified: Secondary | ICD-10-CM | POA: Diagnosis not present

## 2023-11-14 DIAGNOSIS — M810 Age-related osteoporosis without current pathological fracture: Secondary | ICD-10-CM | POA: Diagnosis not present

## 2023-11-14 DIAGNOSIS — I129 Hypertensive chronic kidney disease with stage 1 through stage 4 chronic kidney disease, or unspecified chronic kidney disease: Secondary | ICD-10-CM | POA: Diagnosis not present

## 2023-11-14 DIAGNOSIS — N183 Chronic kidney disease, stage 3 unspecified: Secondary | ICD-10-CM | POA: Diagnosis not present

## 2023-11-15 DIAGNOSIS — M810 Age-related osteoporosis without current pathological fracture: Secondary | ICD-10-CM | POA: Diagnosis not present

## 2023-11-23 ENCOUNTER — Encounter: Payer: Self-pay | Admitting: Obstetrics and Gynecology

## 2023-11-23 ENCOUNTER — Ambulatory Visit (INDEPENDENT_AMBULATORY_CARE_PROVIDER_SITE_OTHER): Admitting: Obstetrics and Gynecology

## 2023-11-23 VITALS — BP 122/76 | HR 95 | Ht 59.0 in | Wt 92.0 lb

## 2023-11-23 DIAGNOSIS — Z87410 Personal history of cervical dysplasia: Secondary | ICD-10-CM

## 2023-11-23 DIAGNOSIS — L900 Lichen sclerosus et atrophicus: Secondary | ICD-10-CM | POA: Diagnosis not present

## 2023-11-23 DIAGNOSIS — M810 Age-related osteoporosis without current pathological fracture: Secondary | ICD-10-CM | POA: Diagnosis not present

## 2023-11-23 DIAGNOSIS — Z01419 Encounter for gynecological examination (general) (routine) without abnormal findings: Secondary | ICD-10-CM | POA: Diagnosis not present

## 2023-11-23 MED ORDER — CLOBETASOL PROPIONATE 0.05 % EX CREA: TOPICAL_CREAM | CUTANEOUS | 1 refills | Status: AC

## 2023-11-23 NOTE — Patient Instructions (Signed)

## 2023-11-23 NOTE — Progress Notes (Signed)
 83 y.o. G47P2002 Widowed Caucasian female here for a breast and pelvic exam.    The patient is also followed for hx of cervical dysplasia and lichen sclerosus. She uses Clobetasol  cream.  Uses the cream just occasionally.     She is followed for osteopenia and has had her BMD at this office.  Her PCP prescribes Fosamax. She had her dexa at [facility].  She states her bone density is improved. Takes calcium twice a week and takes vitamin D  daily.   PCP: Teresa Channel, MD   No LMP recorded. Patient has had a hysterectomy.           Sexually active: No.  The current method of family planning is post menopausal status.    Menopausal hormone therapy:  n/a Exercising: Yes.    Walking  Smoker:  Former   OB History     Gravida  2   Para  2   Term  2   Preterm      AB      Living  2      SAB      IAB      Ectopic      Multiple      Live Births              HEALTH MAINTENANCE: Last 2 paps: 05/19/16 neg, 05/01/13 neg  History of abnormal Pap or positive HPV:  no Mammogram:  06/02/23 Breast Density Cat C, BIRADS Cat 1 neg  Colonoscopy:  2019 Bone Density:  11/10/21  Result  osteopenia    Immunization History  Administered Date(s) Administered   Influenza Split 03/30/2005, 02/25/2009, 04/03/2010, 03/09/2011, 03/11/2012, 02/04/2015   Influenza-Unspecified 02/28/2013, 02/20/2014, 02/09/2016, 02/16/2017, 02/08/2018, 01/31/2019, 03/05/2020, 02/06/2021   PFIZER(Purple Top)SARS-COV-2 Vaccination 06/25/2019, 07/19/2019, 02/12/2020   Pfizer Covid-19 Vaccine Bivalent Booster 18yrs & up 02/24/2021   Pfizer(Comirnaty )Fall Seasonal Vaccine 12 years and older 02/25/2022   Pneumococcal Conjugate-13 10/16/2013   Pneumococcal Polysaccharide-23 04/11/2007   Respiratory Syncytial Virus Vaccine ,Recomb Aduvanted(Arexvy ) 04/05/2022   Td 10/21/2015   Tdap 09/02/2005   Zoster, Live 06/01/2007, 03/27/2008, 12/28/2016, 06/08/2017      reports that she has quit smoking. She has never  used smokeless tobacco. She reports that she does not drink alcohol and does not use drugs.  Past Medical History:  Diagnosis Date   Bursitis    Chronic kidney disease    CP (cerebral palsy) (HCC)    Hypertension    Lichen sclerosus 05/2015   vulvar biopsy   Osteopenia 10/2017   T score -1.5 FRAX 9.6% / 2.1%.  Stable from prior DEXA.   Tremor of both hands    since birth    Past Surgical History:  Procedure Laterality Date   ABDOMINAL HYSTERECTOMY  1975   TAH for cervical dysplasia   CATARACT EXTRACTION     Bilateral   TONSILLECTOMY     TOTAL HIP ARTHROPLASTY Left 02/18/2020   Procedure: TOTAL HIP ARTHROPLASTY ANTERIOR APPROACH;  Surgeon: Ernie Cough, MD;  Location: [facility];  Service: Orthopedics;  Laterality: Left;    Current Outpatient Medications  Medication Sig Dispense Refill   alendronate (FOSAMAX) 70 MG tablet Take by mouth once a week.     Cholecalciferol  (VITAMIN D  PO) Take 1 tablet by mouth daily.      clobetasol  cream (TEMOVATE ) 0.05 % Place the cream on the affected area twice a day for 2 weeks for a flare of symptoms. 30 g 1   lisinopril -hydrochlorothiazide  (ZESTORETIC ) 20-25 MG tablet  1 tablet     Multiple Vitamin (MULTIVITAMIN WITH MINERALS) TABS tablet Take 1 tablet by mouth daily.     omeprazole (PRILOSEC) 40 MG capsule Take 40 mg by mouth daily as needed.     Polyethyl Glycol-Propyl Glycol (SYSTANE) 0.4-0.3 % SOLN See admin instructions.     rosuvastatin (CRESTOR) 5 MG tablet Take 5 mg by mouth daily.     COVID-19 mRNA vaccine 2023-2024 (COMIRNATY ) SUSP injection Inject into the muscle. (Patient not taking: Reported on 11/23/2023) 0.3 mL 0   RSV vaccine recomb adjuvanted (AREXVY ) 120 MCG/0.5ML injection Inject into the muscle. (Patient not taking: Reported on 11/23/2023) 0.5 mL 0   No current facility-administered medications for this visit.    ALLERGIES: Fentanyl  and Fentanyl  and related  Family History  Problem Relation Age of Onset   Diabetes Mother     Hypertension Mother    Heart disease Mother    Cancer Sister        THYROID    Cancer Paternal Grandmother        UTERINE OR OVARIAN?   Hypertension Daughter     Review of Systems  All other systems reviewed and are negative.   PHYSICAL EXAM:  BP 122/76 (BP Location: Right Arm, Patient Position: Sitting)   Pulse 95   Ht 4' 11 (1.499 m)   Wt 92 lb (41.7 kg)   SpO2 98%   BMI 18.58 kg/m     General appearance: alert, cooperative and appears stated age Head: normocephalic, without obvious abnormality, atraumatic Neck: no adenopathy, supple, symmetrical, trachea midline and thyroid  normal to inspection and palpation Lungs: clear to auscultation bilaterally Breasts: normal appearance, no masses or tenderness, No nipple retraction or dimpling, No nipple discharge or bleeding, No axillary adenopathy Heart: regular rate and rhythm Abdomen: soft, non-tender; no masses, no organomegaly Extremities: extremities normal, atraumatic, no cyanosis or edema Skin: skin color, texture, turgor normal. No rashes or lesions Lymph nodes: cervical, supraclavicular, and axillary nodes normal.  Pelvic: External genitalia: hypopigmentation of the periclitoral region.  Fusion of the labia minora and majora.  Hypopigmentation of the midline perineum.               No abnormal inguinal nodes palpated.              Urethra:  normal appearing urethra with no masses, tenderness or lesions              Bartholins and Skenes: normal                 Vagina: normal appearing vagina with normal color and discharge, no lesions              Cervix: absent              Pap taken: no Bimanual Exam:  Uterus:  absent              Adnexa: no mass, fullness, tenderness              Rectal exam: yes.  Confirms.              Anus:  normal sphincter tone, no lesions  Chaperone was present for exam:  Kari HERO, CMA  ASSESSMENT: Encounter for breast and pelvic exam.  Status post TAH for cervical dysplasia in 1975.  Ovaries  remain.  Lichen sclerosus.  Osteopenia of bilateral hips.  On Fosamax through PCP. Hx left hip fracture.  Renal disease.   PLAN: Mammogram screening discussed. Self breast  awareness reviewed. Pap and HRV collected:  no.  Not indicated. Guidelines for Calcium, Vitamin D , regular exercise program including cardiovascular and weight bearing exercise. Medication refills:  Clobetasol  cream.  Use twice a day for 2 weeks for a flare and then twice weekly at hs for maintenance.  Labs with PCP.  Follow up:  yearly and prn.    Additional counseling given.  yes. 20 min  total time was spent for this patient encounter, including preparation, face-to-face counseling with the patient, coordination of care, and documentation of the encounter in addition to doing the breast and pelvic exam.

## 2023-12-15 DIAGNOSIS — I129 Hypertensive chronic kidney disease with stage 1 through stage 4 chronic kidney disease, or unspecified chronic kidney disease: Secondary | ICD-10-CM | POA: Diagnosis not present

## 2023-12-15 DIAGNOSIS — E785 Hyperlipidemia, unspecified: Secondary | ICD-10-CM | POA: Diagnosis not present

## 2023-12-15 DIAGNOSIS — M810 Age-related osteoporosis without current pathological fracture: Secondary | ICD-10-CM | POA: Diagnosis not present

## 2023-12-15 DIAGNOSIS — N183 Chronic kidney disease, stage 3 unspecified: Secondary | ICD-10-CM | POA: Diagnosis not present

## 2024-01-15 DIAGNOSIS — I129 Hypertensive chronic kidney disease with stage 1 through stage 4 chronic kidney disease, or unspecified chronic kidney disease: Secondary | ICD-10-CM | POA: Diagnosis not present

## 2024-01-15 DIAGNOSIS — M810 Age-related osteoporosis without current pathological fracture: Secondary | ICD-10-CM | POA: Diagnosis not present

## 2024-01-15 DIAGNOSIS — E785 Hyperlipidemia, unspecified: Secondary | ICD-10-CM | POA: Diagnosis not present

## 2024-01-15 DIAGNOSIS — N183 Chronic kidney disease, stage 3 unspecified: Secondary | ICD-10-CM | POA: Diagnosis not present

## 2024-03-15 DIAGNOSIS — M810 Age-related osteoporosis without current pathological fracture: Secondary | ICD-10-CM | POA: Diagnosis not present

## 2024-03-15 DIAGNOSIS — I129 Hypertensive chronic kidney disease with stage 1 through stage 4 chronic kidney disease, or unspecified chronic kidney disease: Secondary | ICD-10-CM | POA: Diagnosis not present

## 2024-03-15 DIAGNOSIS — E785 Hyperlipidemia, unspecified: Secondary | ICD-10-CM | POA: Diagnosis not present

## 2024-04-14 ENCOUNTER — Encounter (HOSPITAL_BASED_OUTPATIENT_CLINIC_OR_DEPARTMENT_OTHER): Payer: Self-pay

## 2024-04-14 ENCOUNTER — Other Ambulatory Visit: Payer: Self-pay

## 2024-04-14 ENCOUNTER — Emergency Department (HOSPITAL_BASED_OUTPATIENT_CLINIC_OR_DEPARTMENT_OTHER): Admitting: Radiology

## 2024-04-14 ENCOUNTER — Emergency Department (HOSPITAL_BASED_OUTPATIENT_CLINIC_OR_DEPARTMENT_OTHER)
Admission: EM | Admit: 2024-04-14 | Discharge: 2024-04-14 | Disposition: A | Discharge status: Home / Self Care | Attending: Emergency Medicine | Admitting: Emergency Medicine

## 2024-04-14 DIAGNOSIS — Z79899 Other long term (current) drug therapy: Secondary | ICD-10-CM | POA: Diagnosis not present

## 2024-04-14 DIAGNOSIS — D649 Anemia, unspecified: Secondary | ICD-10-CM | POA: Insufficient documentation

## 2024-04-14 DIAGNOSIS — R051 Acute cough: Secondary | ICD-10-CM

## 2024-04-14 DIAGNOSIS — N189 Chronic kidney disease, unspecified: Secondary | ICD-10-CM | POA: Insufficient documentation

## 2024-04-14 DIAGNOSIS — R059 Cough, unspecified: Secondary | ICD-10-CM | POA: Diagnosis present

## 2024-04-14 DIAGNOSIS — I129 Hypertensive chronic kidney disease with stage 1 through stage 4 chronic kidney disease, or unspecified chronic kidney disease: Secondary | ICD-10-CM | POA: Insufficient documentation

## 2024-04-14 DIAGNOSIS — R251 Tremor, unspecified: Secondary | ICD-10-CM | POA: Insufficient documentation

## 2024-04-14 DIAGNOSIS — J069 Acute upper respiratory infection, unspecified: Secondary | ICD-10-CM | POA: Diagnosis not present

## 2024-04-14 LAB — BASIC METABOLIC PANEL WITH GFR
Anion gap: 12 (ref 5–15)
BUN: 16 mg/dL (ref 8–23)
CO2: 27 mmol/L (ref 22–32)
Calcium: 9.1 mg/dL (ref 8.9–10.3)
Chloride: 95 mmol/L — ABNORMAL LOW (ref 98–111)
Creatinine, Ser: 1.11 mg/dL — ABNORMAL HIGH (ref 0.44–1.00)
GFR, Estimated: 49 mL/min — ABNORMAL LOW (ref >60)
Glucose, Bld: 109 mg/dL — ABNORMAL HIGH (ref 70–99)
Potassium: 3.8 mmol/L (ref 3.5–5.1)
Sodium: 134 mmol/L — ABNORMAL LOW (ref 135–145)

## 2024-04-14 LAB — RESP PANEL BY RT-PCR (RSV, FLU A&B, COVID)  RVPGX2
Influenza A by PCR: NEGATIVE
Influenza B by PCR: NEGATIVE
Resp Syncytial Virus by PCR: NEGATIVE
SARS Coronavirus 2 by RT PCR: NEGATIVE

## 2024-04-14 LAB — CBC WITH DIFFERENTIAL/PLATELET
Abs Immature Granulocytes: 0.01 K/uL (ref 0.00–0.07)
Basophils Absolute: 0 K/uL (ref 0.0–0.1)
Basophils Relative: 0 %
Eosinophils Absolute: 0 K/uL (ref 0.0–0.5)
Eosinophils Relative: 0 %
HCT: 32.6 % — ABNORMAL LOW (ref 36.0–46.0)
Hemoglobin: 11.2 g/dL — ABNORMAL LOW (ref 12.0–15.0)
Immature Granulocytes: 0 %
Lymphocytes Relative: 26 %
Lymphs Abs: 1.7 K/uL (ref 0.7–4.0)
MCH: 31.6 pg (ref 26.0–34.0)
MCHC: 34.4 g/dL (ref 30.0–36.0)
MCV: 92.1 fL (ref 80.0–100.0)
Monocytes Absolute: 0.6 K/uL (ref 0.1–1.0)
Monocytes Relative: 10 %
Neutro Abs: 3.9 K/uL (ref 1.7–7.7)
Neutrophils Relative %: 64 %
Platelets: 185 K/uL (ref 150–400)
RBC: 3.54 MIL/uL — ABNORMAL LOW (ref 3.87–5.11)
RDW: 12 % (ref 11.5–15.5)
WBC: 6.2 K/uL (ref 4.0–10.5)
nRBC: 0 % (ref 0.0–0.2)

## 2024-04-14 LAB — D-DIMER, QUANTITATIVE: D-Dimer, Quant: 0.51 ug{FEU}/mL — ABNORMAL HIGH (ref 0.00–0.50)

## 2024-04-14 MED ORDER — GUAIFENESIN 100 MG/5ML PO LIQD: 100.0000 mg | ORAL | 0 refills | Status: AC | PRN

## 2024-04-14 NOTE — ED Provider Notes (Signed)
 [place] EMERGENCY DEPARTMENT AT [facility] Provider Note   CSN: [id number] Arrival date & time: 04/14/24  0510     Patient presents with: Cough   Wendy Lewis is a 83 y.o. female.    Cough    83 year old female with medical history significant for cerebral palsy, HTN, CKD presenting to the emergency department with a chief complaint of cough and congestion for the past week.  She denies any chest pain, shortness of breath.  She denies any fevers or chills.  Symptoms been ongoing since this past Sunday.  Denies any lower extremity swelling or cramping.  Her cough has been nonproductive.  Prior to Admission medications   Medication Sig Start Date End Date Taking? Authorizing Provider  alendronate (FOSAMAX) 70 MG tablet Take by mouth once a week. 05/21/20   [provider]  Cholecalciferol  (VITAMIN D  PO) Take 1 tablet by mouth daily.     [provider]  clobetasol  cream (TEMOVATE ) 0.05 % Place the cream on the affected area twice a day for 2 weeks for a flare of symptoms. May apply to the affected area twice a week at bedtime for maintenance dosing. 11/23/23   Cathlyn JAYSON Nikki Bobie FORBES, MD  COVID-19 mRNA vaccine [phone] (COMIRNATY ) SUSP injection Inject into the muscle. Patient not taking: Reported on 11/23/2023 02/25/22   Luiz Channel, MD  lisinopril -hydrochlorothiazide  (ZESTORETIC ) 20-25 MG tablet 1 tablet 04/29/20   [provider]  Multiple Vitamin (MULTIVITAMIN WITH MINERALS) TABS tablet Take 1 tablet by mouth daily.    [provider]  omeprazole (PRILOSEC) 40 MG capsule Take 40 mg by mouth daily as needed. 10/04/23   [provider]  Polyethyl Glycol-Propyl Glycol (SYSTANE) 0.4-0.3 % SOLN See admin instructions.    [provider]  rosuvastatin (CRESTOR) 5 MG tablet Take 5 mg by mouth daily. 08/28/23   [provider]  RSV vaccine recomb adjuvanted (AREXVY ) 120 MCG/0.5ML injection Inject into the  muscle. Patient not taking: Reported on 11/23/2023 04/05/22       Allergies: Fentanyl  and Fentanyl  and related    Review of Systems  Respiratory:  Positive for cough.   All other systems reviewed and are negative.   Updated Vital Signs BP 138/79 (BP Location: Left Arm)   Pulse 92   Temp 98.1 F (36.7 C) (Oral)   Resp 18   Ht 4' 11 (1.499 m)   Wt 41 kg   SpO2 97%   BMI 18.26 kg/m   Physical Exam Vitals and nursing note reviewed.  Constitutional:      General: She is not in acute distress. HENT:     Head: Normocephalic and atraumatic.  Eyes:     Conjunctiva/sclera: Conjunctivae normal.     Pupils: Pupils are equal, round, and reactive to light.  Cardiovascular:     Rate and Rhythm: Normal rate and regular rhythm.  Pulmonary:     Effort: Pulmonary effort is normal. No respiratory distress.     Breath sounds: Normal breath sounds.  Abdominal:     General: There is no distension.     Tenderness: There is no abdominal tenderness. There is no guarding.  Musculoskeletal:        General: No deformity or signs of injury.     Cervical back: Neck supple.  Skin:    Findings: No lesion or rash.  Neurological:     General: No focal deficit present.     Mental Status: She is alert. Mental status is at baseline.  Comments: Baseline tremor present     (all labs ordered are listed, but only abnormal results are displayed) Labs Reviewed - No data to display  EKG: None  Radiology: No results found.   Procedures   Medications Ordered in the ED - No data to display  Clinical Course as of 04/14/24 0701  Sat Apr 14, 2024  0700 Assumed care from Dr Jerrol. 83 yo F with hx of mild CP who pw dry cough and congestion x 5 days. No chest pain or SOB. CXR negative and COVID/Flu negative. Has a dimer pending.  [RP]    Clinical Course User Index [RP] Yolande Lamar BROCKS, MD                                 Medical Decision Making Amount and/or Complexity of Data  Reviewed Labs: ordered. Radiology: ordered.     83 year old female with medical history significant for cerebral palsy, HTN, CKD presenting to the emergency department with a chief complaint of cough and congestion for the past week.  She denies any chest pain, shortness of breath.  She denies any fevers or chills.  Symptoms been ongoing since this past Sunday.  Denies any lower extremity swelling or cramping.  Her cough has been nonproductive.  On arrival, the patient was afebrile, not tachycardic or tachypneic, BP 138/79, saturating 97% on room air.  On exam the patient had clear lungs auscultation bilaterally.  She is in no respiratory distress.  Presenting with cough for the past 5 days and nasal congestion.  Considered viral upper respiratory infection, considered bacterial pneumonia.  No chest pain or shortness of breath.  Low concern for PE, ACS, pneumothorax, CHF.  Labs: COVID, flu, RSV PCR testing collected and negative, CBC without a leukocytosis, mild anemia 11.2, BMP generally unremarkable  CXR: No active disease  D-dimer obtained in the setting of dry cough for 1 week, pending at time of signout.  Signout given to Dr. Yolande to reassess the patient pending results of D-dimer.      Final diagnoses:  None    ED Discharge Orders     None          Jerrol Agent, MD 04/14/24 [phone]

## 2024-04-14 NOTE — ED Triage Notes (Signed)
 Pt to exam [address] [facility] c/o cough/congestion x 1 week. Pt denies CP SOB.  VSS NAD PT on room air.

## 2024-04-14 NOTE — ED Provider Notes (Signed)
  Physical Exam  BP 138/79 (BP Location: Left Arm)   Pulse 92   Temp 98.1 F (36.7 C) (Oral)   Resp 18   Ht 4' 11 (1.499 m)   Wt 41 kg   SpO2 97%   BMI 18.26 kg/m   Physical Exam  Procedures  Procedures  ED Course / MDM   Clinical Course as of 04/14/24 0725  Sat Apr 14, 2024  0700 Assumed care from Dr Jerrol. 83 yo F with hx of mild CP who pw dry cough and congestion x 5 days. No chest pain or SOB. CXR negative and COVID/Flu negative. Has a dimer pending.  [RP]  0704 D-Dimer, Quant(!): 0.51 Within age adjusted limits.  [RP]  [phone] Patient reassessed.  She satting well on room air.  She is overall well-appearing.  Lungs clear to auscultation bilaterally.  She has had a negative chest x-ray and COVID and flu.  Lab work unremarkable.  Suspect that she has a URI that is causing her cough.  She is requesting some cough medicine with a decongestion so I have given her a prescription for Mucinex.  Will have her follow-up with her primary doctor in several days as well.  Return precautions discussed with her and her daughter prior to discharge. [RP]    Clinical Course User Index [RP] Yolande Lamar BROCKS, MD   Medical Decision Making Amount and/or Complexity of Data Reviewed Labs: ordered. Decision-making details documented in ED Course. Radiology: ordered.  Risk OTC drugs.       Yolande Lamar BROCKS, MD 04/14/24 [phone]

## 2024-04-14 NOTE — ED Notes (Signed)
 Pt alert and oriented X 4 at the time of discharge. RR even and unlabored. No acute distress noted. Pt verbalized understanding of discharge instructions as discussed. Pt ambulatory to lobby at time of discharge.

## 2024-04-14 NOTE — Discharge Instructions (Signed)
 You were seen for your upper respiratory tract infection in the emergency department.   At home, please use Tylenol  for your muscle aches and fevers.  Please use over-the-counter cough medication or tea with honey for your cough.  You may also use the Mucinex we have prescribed you  Follow-up with your primary doctor in 2-3 days regarding your visit.  This may be over the phone.  Return immediately to the emergency department if you experience any of the following: Difficulty breathing, or any other concerning symptoms.    Thank you for visiting our Emergency Department. It was a pleasure taking care of you today.

## 2024-11-28 ENCOUNTER — Ambulatory Visit: Admitting: Obstetrics and Gynecology
